# Patient Record
Sex: Male | Born: 1980 | Race: White | Hispanic: No | Marital: Married | Smoking: Former smoker
Health system: Southern US, Community
[De-identification: ages and names within clinical notes are randomized; demographics above are authoritative.]

## PROBLEM LIST (undated history)

## (undated) DIAGNOSIS — M720 Palmar fascial fibromatosis [Dupuytren]: Secondary | ICD-10-CM

## (undated) DIAGNOSIS — S62143A Displaced fracture of body of hamate [unciform] bone, unspecified wrist, initial encounter for closed fracture: Secondary | ICD-10-CM

## (undated) DIAGNOSIS — L989 Disorder of the skin and subcutaneous tissue, unspecified: Secondary | ICD-10-CM

## (undated) DIAGNOSIS — L719 Rosacea, unspecified: Secondary | ICD-10-CM

## (undated) DIAGNOSIS — F32A Depression, unspecified: Secondary | ICD-10-CM

## (undated) DIAGNOSIS — F419 Anxiety disorder, unspecified: Secondary | ICD-10-CM

## (undated) DIAGNOSIS — S6291XA Unspecified fracture of right wrist and hand, initial encounter for closed fracture: Secondary | ICD-10-CM

## (undated) DIAGNOSIS — S42023A Displaced fracture of shaft of unspecified clavicle, initial encounter for closed fracture: Secondary | ICD-10-CM

## (undated) DIAGNOSIS — Z0000 Encounter for general adult medical examination without abnormal findings: Principal | ICD-10-CM

## (undated) DIAGNOSIS — F329 Major depressive disorder, single episode, unspecified: Secondary | ICD-10-CM

## (undated) DIAGNOSIS — L659 Nonscarring hair loss, unspecified: Secondary | ICD-10-CM

## (undated) DIAGNOSIS — B019 Varicella without complication: Secondary | ICD-10-CM

## (undated) DIAGNOSIS — G4700 Insomnia, unspecified: Secondary | ICD-10-CM

## (undated) HISTORY — DX: Insomnia, unspecified: G47.00

## (undated) HISTORY — DX: Varicella without complication: B01.9

## (undated) HISTORY — PX: HAND SURGERY: SHX662

## (undated) HISTORY — DX: Anxiety disorder, unspecified: F41.9

## (undated) HISTORY — DX: Major depressive disorder, single episode, unspecified: F32.9

## (undated) HISTORY — DX: Displaced fracture of body of hamate (unciform) bone, unspecified wrist, initial encounter for closed fracture: S62.143A

## (undated) HISTORY — DX: Encounter for general adult medical examination without abnormal findings: Z00.00

## (undated) HISTORY — DX: Palmar fascial fibromatosis (dupuytren): M72.0

## (undated) HISTORY — DX: Rosacea, unspecified: L71.9

## (undated) HISTORY — DX: Depression, unspecified: F32.A

## (undated) HISTORY — DX: Unspecified fracture of right wrist and hand, initial encounter for closed fracture: S62.91XA

## (undated) HISTORY — DX: Displaced fracture of shaft of unspecified clavicle, initial encounter for closed fracture: S42.023A

## (undated) HISTORY — DX: Disorder of the skin and subcutaneous tissue, unspecified: L98.9

## (undated) HISTORY — DX: Nonscarring hair loss, unspecified: L65.9

---
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
## 2013-09-20 ENCOUNTER — Ambulatory Visit (INDEPENDENT_AMBULATORY_CARE_PROVIDER_SITE_OTHER): Payer: Private Health Insurance - Indemnity | Admitting: Family Medicine

## 2013-09-20 ENCOUNTER — Encounter: Payer: Self-pay | Admitting: Family Medicine

## 2013-09-20 ENCOUNTER — Ambulatory Visit (HOSPITAL_BASED_OUTPATIENT_CLINIC_OR_DEPARTMENT_OTHER)
Admission: RE | Admit: 2013-09-20 | Discharge: 2013-09-20 | Disposition: A | Discharge status: Home / Self Care | Payer: Private Health Insurance - Indemnity | Source: Ambulatory Visit | Attending: Family Medicine | Admitting: Family Medicine

## 2013-09-20 VITALS — BP 112/70 | HR 61 | Temp 98.1°F | Ht 70.0 in | Wt 173.1 lb

## 2013-09-20 DIAGNOSIS — X58XXXA Exposure to other specified factors, initial encounter: Secondary | ICD-10-CM | POA: Insufficient documentation

## 2013-09-20 DIAGNOSIS — F32A Depression, unspecified: Secondary | ICD-10-CM

## 2013-09-20 DIAGNOSIS — Z23 Encounter for immunization: Secondary | ICD-10-CM

## 2013-09-20 DIAGNOSIS — S62143A Displaced fracture of body of hamate [unciform] bone, unspecified wrist, initial encounter for closed fracture: Secondary | ICD-10-CM | POA: Insufficient documentation

## 2013-09-20 DIAGNOSIS — M79609 Pain in unspecified limb: Secondary | ICD-10-CM

## 2013-09-20 DIAGNOSIS — F419 Anxiety disorder, unspecified: Secondary | ICD-10-CM

## 2013-09-20 DIAGNOSIS — L659 Nonscarring hair loss, unspecified: Secondary | ICD-10-CM | POA: Insufficient documentation

## 2013-09-20 DIAGNOSIS — F341 Dysthymic disorder: Secondary | ICD-10-CM

## 2013-09-20 DIAGNOSIS — Z0000 Encounter for general adult medical examination without abnormal findings: Secondary | ICD-10-CM | POA: Insufficient documentation

## 2013-09-20 DIAGNOSIS — M79641 Pain in right hand: Secondary | ICD-10-CM

## 2013-09-20 DIAGNOSIS — S62141A Displaced fracture of body of hamate [unciform] bone, right wrist, initial encounter for closed fracture: Secondary | ICD-10-CM

## 2013-09-20 DIAGNOSIS — F329 Major depressive disorder, single episode, unspecified: Secondary | ICD-10-CM

## 2013-09-20 HISTORY — DX: Encounter for general adult medical examination without abnormal findings: Z00.00

## 2013-09-20 HISTORY — DX: Nonscarring hair loss, unspecified: L65.9

## 2013-09-20 HISTORY — DX: Displaced fracture of body of hamate (unciform) bone, unspecified wrist, initial encounter for closed fracture: S62.143A

## 2013-09-20 LAB — HEPATIC FUNCTION PANEL
ALT: 103 U/L — ABNORMAL HIGH (ref 0–53)
AST: 79 U/L — ABNORMAL HIGH (ref 0–37)
Albumin: 4.3 g/dL (ref 3.5–5.2)
Alkaline Phosphatase: 89 U/L (ref 39–117)
Indirect Bilirubin: 1 mg/dL — ABNORMAL HIGH (ref 0.0–0.9)
Total Protein: 6.7 g/dL (ref 6.0–8.3)

## 2013-09-20 LAB — LIPID PANEL
Cholesterol: 140 mg/dL (ref 0–200)
HDL: 77 mg/dL (ref >39)
LDL Cholesterol: 53 mg/dL (ref 0–99)
Total CHOL/HDL Ratio: 1.8 Ratio
Triglycerides: 52 mg/dL (ref <150)
VLDL: 10 mg/dL (ref 0–40)

## 2013-09-20 LAB — RENAL FUNCTION PANEL
BUN: 10 mg/dL (ref 6–23)
Calcium: 9.5 mg/dL (ref 8.4–10.5)
Creat: 0.81 mg/dL (ref 0.50–1.35)
Glucose, Bld: 78 mg/dL (ref 70–99)
Phosphorus: 2.9 mg/dL (ref 2.3–4.6)
Potassium: 4.8 mEq/L (ref 3.5–5.3)

## 2013-09-20 LAB — TSH: TSH: 0.865 u[IU]/mL (ref 0.350–4.500)

## 2013-09-20 MED ORDER — ESCITALOPRAM OXALATE 10 MG PO TABS: 10.0000 mg | ORAL_TABLET | Freq: Every day | ORAL | Status: DC

## 2013-09-20 MED ORDER — ALPRAZOLAM 0.25 MG PO TABS: 0.2500 mg | ORAL_TABLET | Freq: Two times a day (BID) | ORAL | Status: DC | PRN

## 2013-09-20 NOTE — Assessment & Plan Note (Signed)
Injured his hand, right 3 months ago initial xray negative, today xray shows aubacute fracture. Referred to ortho for further consideration. Given hand splint today to rest it and encouraged to use Salon pas and ice bid

## 2013-09-20 NOTE — Assessment & Plan Note (Signed)
Has been using Finasteride and Rogaine for a couple of months. Will consider referral to dermatology if persists

## 2013-09-20 NOTE — Patient Instructions (Addendum)
Preventive Care for Adults, Male  A healthy lifestyle and preventive care can promote health and wellness. Preventive health guidelines for men include the following key practices:  · A routine yearly physical is a good way to check with your caregiver about your health and preventative screening. It is a chance to share any concerns and updates on your health, and to receive a thorough exam.  · Visit your dentist for a routine exam and preventative care every 6 months. Brush your teeth twice a day and floss once a day. Good oral hygiene prevents tooth decay and gum disease.  · The frequency of eye exams is based on your age, health, family medical history, use of contact lenses, and other factors. Follow your caregiver's recommendations for frequency of eye exams.  · Eat a healthy diet. Foods like vegetables, fruits, whole grains, low-fat dairy products, and lean protein foods contain the nutrients you need without too many calories. Decrease your intake of foods high in solid fats, added sugars, and salt. Eat the right amount of calories for you. Get information about a proper diet from your caregiver, if necessary.  · Regular physical exercise is one of the most important things you can do for your health. Most adults should get at least 150 minutes of moderate-intensity exercise (any activity that increases your heart rate and causes you to sweat) each week. In addition, most adults need muscle-strengthening exercises on 2 or more days a week.  · Maintain a healthy weight. The body mass index (BMI) is a screening tool to identify possible weight problems. It provides an estimate of body fat based on height and weight. Your caregiver can help determine your BMI, and can help you achieve or maintain a healthy weight. For adults 20 years and older:  · A BMI below 18.5 is considered underweight.  · A BMI of 18.5 to 24.9 is normal.  · A BMI of 25 to 29.9 is considered overweight.  · A BMI of 30 and above is  considered obese.  · Maintain normal blood lipids and cholesterol levels by exercising and minimizing your intake of saturated fat. Eat a balanced diet with plenty of fruit and vegetables. Blood tests for lipids and cholesterol should begin at age 20 and be repeated every 5 years. If your lipid or cholesterol levels are high, you are over 50, or you are a high risk for heart disease, you may need your cholesterol levels checked more frequently. Ongoing high lipid and cholesterol levels should be treated with medicines if diet and exercise are not effective.  · If you smoke, find out from your caregiver how to quit. If you do not use tobacco, do not start.  · If you choose to drink alcohol, do not exceed 2 drinks per day. One drink is considered to be 12 ounces (355 mL) of beer, 5 ounces (148 mL) of wine, or 1.5 ounces (44 mL) of liquor.  · Avoid use of street drugs. Do not share needles with anyone. Ask for help if you need support or instructions about stopping the use of drugs.  · High blood pressure causes heart disease and increases the risk of stroke. Your blood pressure should be checked at least every 1 to 2 years. Ongoing high blood pressure should be treated with medicines, if weight loss and exercise are not effective.  · If you are 45 to 33 years old, ask your caregiver if you should take aspirin to prevent heart disease.  · Diabetes screening involves taking   a blood sample to check your fasting blood sugar level. This should be done once every 3 years, after age 45, if you are within normal weight and without risk factors for diabetes. Testing should be considered at a younger age or be carried out more frequently if you are overweight and have at least 1 risk factor for diabetes.  · Colorectal cancer can be detected and often prevented. Most routine colorectal cancer screening begins at the age of 50 and continues through age 75. However, your caregiver may recommend screening at an earlier age if you  have risk factors for colon cancer. On a yearly basis, your caregiver may provide home test kits to check for hidden blood in the stool. Use of a small camera at the end of a tube, to directly examine the colon (sigmoidoscopy or colonoscopy), can detect the earliest forms of colorectal cancer. Talk to your caregiver about this at age 50, when routine screening begins.  Direct examination of the colon should be repeated every 5 to 10 years through age 75, unless early forms of pre-cancerous polyps or small growths are found.  · Hepatitis C blood testing is recommended for all people born from 1945 through 1965 and any individual with known risks for hepatitis C.  · Practice safe sex. Use condoms and avoid high-risk sexual practices to reduce the spread of sexually transmitted infections (STIs). STIs include gonorrhea, chlamydia, syphilis, trichomonas, herpes, HPV, and human immunodeficiency virus (HIV). Herpes, HIV, and HPV are viral illnesses that have no cure. They can result in disability, cancer, and death.  · A one-time screening for abdominal aortic aneurysm (AAA) and surgical repair of large AAAs by sound wave imaging (ultrasonography) is recommended for ages 65 to 75 years who are current or former smokers.  · Healthy men should no longer receive prostate-specific antigen (PSA) blood tests as part of routine cancer screening. Consult with your caregiver about prostate cancer screening.  · Testicular cancer screening is not recommended for adult males who have no symptoms. Screening includes self-exam, caregiver exam, and other screening tests. Consult with your caregiver about any symptoms you have or any concerns you have about testicular cancer.  · Use sunscreen with skin protection factor (SPF) of 30 or more. Apply sunscreen liberally and repeatedly throughout the day. You should seek shade when your shadow is shorter than you. Protect yourself by wearing long sleeves, pants, a wide-brimmed hat, and  sunglasses year round, whenever you are outdoors.  · Once a month, do a whole body skin exam, using a mirror to look at the skin on your back. Notify your caregiver of new moles, moles that have irregular borders, moles that are larger than a pencil eraser, or moles that have changed in shape or color.  · Stay current with required immunizations.  · Influenza. You need a dose every fall (or winter). The composition of the flu vaccine changes each year, so being vaccinated once is not enough.  · Pneumococcal polysaccharide. You need 1 to 2 doses if you smoke cigarettes or if you have certain chronic medical conditions. You need 1 dose at age 65 (or older) if you have never been vaccinated.  · Tetanus, diphtheria, pertussis (Tdap, Td). Get 1 dose of Tdap vaccine if you are younger than age 65 years, are over 65 and have contact with an infant, are a healthcare worker, or simply want to be protected from whooping cough. After that, you need a Td booster dose every 10 years. Consult your   caregiver if you have not had at least 3 tetanus and diphtheria-containing shots sometime in your life or have a deep or dirty wound.  · HPV. This vaccine is recommended for males 13 through 33 years of age. This vaccine may be given to men 22 through 33 years of age who have not completed the 3 dose series. It is recommended for men through age 26 who have sex with men or whose immune system is weakened because of HIV infection, other illness, or medications. The vaccine is given in 3 doses over 6 months.  · Measles, mumps, rubella (MMR). You need at least 1 dose of MMR if you were born in 1957 or later. You may also need a 2nd dose.  · Meningococcal. If you are age 19 to 21 years and a first-year college student living in a residence hall, or have one of several medical conditions, you need to get vaccinated against meningococcal disease. You may also need additional booster doses.  · Zoster (shingles). If you are age 60 years or  older, you should get this vaccine.  · Varicella (chickenpox). If you have never had chickenpox or you were vaccinated but received only 1 dose, talk to your caregiver to find out if you need this vaccine.  · Hepatitis A. You need this vaccine if you have a specific risk factor for hepatitis A virus infection, or you simply wish to be protected from this disease. The vaccine is usually given as 2 doses, 6 to 18 months apart.  · Hepatitis B. You need this vaccine if you have a specific risk factor for hepatitis B virus infection or you simply wish to be protected from this disease. The vaccine is given in 3 doses, usually over 6 months.  Preventative Service / Frequency  Ages 19 to 39  · Blood pressure check.** / Every 1 to 2 years.  · Lipid and cholesterol check.** / Every 5 years beginning at age 20.  · Hepatitis C blood test.** / For any individual with known risks for hepatitis C.  · Skin self-exam. / Monthly.  · Influenza immunization.** / Every year.  · Pneumococcal polysaccharide immunization.** / 1 to 2 doses if you smoke cigarettes or if you have certain chronic medical conditions.  · Tetanus, diphtheria, pertussis (Tdap,Td) immunization. / A one-time dose of Tdap vaccine. After that, you need a Td booster dose every 10 years.  · HPV immunization. / 3 doses over 6 months, if 26 and younger.  · Measles, mumps, rubella (MMR) immunization. / You need at least 1 dose of MMR if you were born in 1957 or later. You may also need a 2nd dose.  · Meningococcal immunization. / 1 dose if you are age 19 to 21 years and a first-year college student living in a residence hall, or have one of several medical conditions, you need to get vaccinated against meningococcal disease. You may also need additional booster doses.  · Varicella immunization.** / Consult your caregiver.  · Hepatitis A immunization.** / Consult your caregiver. 2 doses, 6 to 18 months apart.  · Hepatitis B immunization.** / Consult your caregiver. 3 doses  usually over 6 months.  Ages 40 to 64  · Blood pressure check.** / Every 1 to 2 years.  · Lipid and cholesterol check.** / Every 5 years beginning at age 20.  · Fecal occult blood test (FOBT) of stool. / Every year beginning at age 50 and continuing until age 75. You may not have   to do this test if you get colonoscopy every 10 years.  · Flexible sigmoidoscopy** or colonoscopy.** / Every 5 years for a flexible sigmoidoscopy or every 10 years for a colonoscopy beginning at age 50 and continuing until age 75.  · Hepatitis C blood test.** / For all people born from 1945 through 1965 and any individual with known risks for hepatitis C.  · Skin self-exam. / Monthly.  · Influenza immunization.** / Every year.  · Pneumococcal polysaccharide immunization.** / 1 to 2 doses if you smoke cigarettes or if you have certain chronic medical conditions.  · Tetanus, diphtheria, pertussis (Tdap/Td) immunization.** / A one-time dose of Tdap vaccine. After that, you need a Td booster dose every 10 years.  · Measles, mumps, rubella (MMR) immunization.  / You need at least 1 dose of MMR if you were born in 1957 or later. You may also need a 2nd dose.  · Varicella immunization.**/ Consult your caregiver.  · Meningococcal immunization.** / Consult your caregiver.  · Hepatitis A immunization.** / Consult your caregiver. 2 doses, 6 to 18 months apart.  · Hepatitis B immunization.** / Consult your caregiver. 3 doses, usually over 6 months.  Ages 65 and over  · Blood pressure check.** / Every 1 to 2 years.  · Lipid and cholesterol check.**/ Every 5 years beginning at age 20.  · Fecal occult blood test (FOBT) of stool. / Every year beginning at age 50 and continuing until age 75. You may not have to do this test if you get colonoscopy every 10 years.  · Flexible sigmoidoscopy** or colonoscopy.** / Every 5 years for a flexible sigmoidoscopy or every 10 years for a colonoscopy beginning at age 50 and continuing until age 75.  · Hepatitis C blood  test.** / For all people born from 1945 through 1965 and any individual with known risks for hepatitis C.  · Abdominal aortic aneurysm (AAA) screening.** / A one-time screening for ages 65 to 75 years who are current or former smokers.  · Skin self-exam. / Monthly.  · Influenza immunization.** / Every year.  · Pneumococcal polysaccharide immunization.** / 1 dose at age 65 (or older) if you have never been vaccinated.  · Tetanus, diphtheria, pertussis (Tdap, Td) immunization. / A one-time dose of Tdap vaccine if you are over 65 and have contact with an infant, are a healthcare worker, or simply want to be protected from whooping cough. After that, you need a Td booster dose every 10 years.  · Varicella immunization. ** / Consult your caregiver.  · Meningococcal immunization.** / Consult your caregiver.  · Hepatitis A immunization. ** / Consult your caregiver. 2 doses, 6 to 18 months apart.  · Hepatitis B immunization.** / Check with your caregiver. 3 doses, usually over 6 months.  **Family history and personal history of risk and conditions may change your caregiver's recommendations.  Document Released: 02/04/2002 Document Revised: 03/02/2012 Document Reviewed: 05/06/2011  [employer]® Patient Information ©2014 [employer].

## 2013-09-20 NOTE — Progress Notes (Signed)
Patient ID: Brandon [name], male   DOB: [birth year], 32 y.o.   MRN: [id number] Brandon [name] [id number] 03/09/1981 09/20/2013      Progress Note-Follow Up  Subjective  Chief Complaint  Chief Complaint  Patient presents with  . Establish Care    new patient  . Injections    flu    HPI  Patient is a 32 year old Caucasian male who is in today for new patient appointment with several concerns. First is he injured his right hand while doing renovations on his house about 3 months ago. He's had persistent pain and swelling ever since. Pain is at the aspect of the hand he suffered blunt trauma an x-ray initially did not show any fracture at bedtime. Now any ulnar or radial rotation of the wrist causes increased pain. Gripping is painful. He also notes a high level of anxiety it is noted that patient's job. He is having episodes of panic attacks with palpitations and shortness of breath occurring randomly. Otherwise she reports health. He has been struggling with hair loss for the last year and has been using Propecia and Rogaine for a couple months with good results. Has taken Paxil in the past for anxiety and college and does believe it was helpful  Past Medical History  Diagnosis Date  . Chicken pox as a child  . Anxiety   . Anxiety and depression   . Alopecia 09/20/2013  . Closed hamate fracture 09/20/2013  . Preventative health care 09/20/2013    Past Surgical History  Procedure Laterality Date  . Wisdom tooth extraction  1991    Family History  Problem Relation Age of Onset  . Depression Mother   . Anxiety disorder Mother   . Multiple sclerosis Mother   . Atrial fibrillation Father   . Hyperlipidemia Father   . Heart disease Maternal Grandfather   . Stroke Maternal Grandfather     History   Social History  . Marital Status: Single    Spouse Name: N/A    Number of Children: N/A  . Years of Education: N/A   Occupational History  . Not on file.   Social History Main Topics   . Smoking status: Former Smoker -- 0.01 packs/day for 3 years    Types: Cigarettes    Start date: 12/24/2011  . Smokeless tobacco: Never Used     Comment: off and on for a couple years  . Alcohol Use: Yes     Comment: 12 beers  . Drug Use: No  . Sexual Activity: Yes    Partners: Female   Other Topics Concern  . Not on file   Social History Narrative  . No narrative on file    No current outpatient prescriptions on file prior to visit.   No current facility-administered medications on file prior to visit.    No Known Allergies  Review of Systems  Review of Systems  Constitutional: Negative for fever and malaise/fatigue.  HENT: Negative for congestion.   Eyes: Negative for discharge.  Respiratory: Negative for shortness of breath.   Cardiovascular: Negative for chest pain, palpitations and leg swelling.  Gastrointestinal: Negative for nausea, abdominal pain and diarrhea.  Genitourinary: Negative for dysuria.  Musculoskeletal: Positive for joint pain. Negative for myalgias and falls.       Right hand pain alon ulnar aspect. Suffered trauma to hand 3 months ago with persistent pain and swelling.   Skin: Negative for rash.  Neurological: Negative for loss of consciousness and headaches.  Endo/Heme/Allergies: Negative  for polydipsia.  Psychiatric/Behavioral: Negative for depression and suicidal ideas. The patient is not nervous/anxious and does not have insomnia.     Objective  BP 112/70  Pulse 61  Temp(Src) 98.1 F (36.7 C) (Oral)  Ht 5\' 10"  (1.778 m)  Wt 173 lb 1.3 oz (78.509 kg)  BMI 24.83 kg/m2  SpO2 97%  Physical Exam  Physical Exam  Constitutional: He is oriented to person, place, and time and well-developed, well-nourished, and in no distress. No distress.  HENT:  Head: Normocephalic and atraumatic.  Eyes: Conjunctivae are normal.  Neck: Neck supple. No thyromegaly present.  Cardiovascular: Normal rate, regular rhythm and normal heart sounds.   No  murmur heard. Pulmonary/Chest: Effort normal and breath sounds normal. No respiratory distress.  Abdominal: He exhibits no distension and no mass. There is no tenderness.  Musculoskeletal: He exhibits edema and tenderness.  Over base of 5 th metatarsal swelling and pain  Neurological: He is alert and oriented to person, place, and time.  Skin: Skin is warm.  Psychiatric: Memory, affect and judgment normal.       Assessment & Plan  Alopecia Has been using Finasteride and Rogaine for a couple of months. Will consider referral to dermatology if persists  Anxiety and depression Significant stress at work started on Lexapro 10 mg daily and may use Alprazolam prn and return in 8 weeks or as needed. Warned regarding possible side effects of both meds.  Closed hamate fracture Injured his hand, right 3 months ago initial xray negative, today xray shows aubacute fracture. Referred to ortho for further consideration. Given hand splint today to rest it and encouraged to use Salon pas and ice bid  Preventative health care Fasting labs checked today, encouraged heart healthy diet and regular exercise. Given flu shot today

## 2013-09-20 NOTE — Assessment & Plan Note (Addendum)
Fasting labs checked today, encouraged heart healthy diet and regular exercise. Given flu shot today

## 2013-09-20 NOTE — Assessment & Plan Note (Signed)
Significant stress at work started on Lexapro 10 mg daily and may use Alprazolam prn and return in 8 weeks or as needed. Warned regarding possible side effects of both meds.

## 2013-09-21 ENCOUNTER — Telehealth: Payer: Self-pay

## 2013-09-21 LAB — CBC
HCT: 45.3 % (ref 39.0–52.0)
Hemoglobin: 16 g/dL (ref 13.0–17.0)
MCHC: 35.3 g/dL (ref 30.0–36.0)

## 2013-09-21 NOTE — Telephone Encounter (Signed)
I already did his ortho appt yesterday

## 2013-09-21 NOTE — Telephone Encounter (Signed)
Pt notified of fracture. Pt states needs a referral put in to f/u with ortho. Pt requesting [facility].

## 2013-09-21 NOTE — Telephone Encounter (Signed)
Message copied by Darnell Level on Tue Sep 21, 2013 11:51 AM ------      Message from: Danise Edge A      Created: Mon Sep 20, 2013  4:36 PM       Notify fracture of hamate bone in right hand, subacute, follow up with ortho ------

## 2013-09-22 ENCOUNTER — Telehealth: Payer: Self-pay

## 2013-09-22 NOTE — Telephone Encounter (Signed)
Printed and pt informed that its at the front desk

## 2013-09-22 NOTE — Telephone Encounter (Signed)
Pt called and stated he called and rescheduled his Ortho appt for tomorrow, they are requesting that he bring a copy of the hand images/x-ray. Patient needs to pick up before tomorrow before 3:00 pm.

## 2013-10-16 DIAGNOSIS — S42023A Displaced fracture of shaft of unspecified clavicle, initial encounter for closed fracture: Secondary | ICD-10-CM

## 2013-10-16 HISTORY — DX: Displaced fracture of shaft of unspecified clavicle, initial encounter for closed fracture: S42.023A

## 2013-11-23 ENCOUNTER — Ambulatory Visit: Payer: Private Health Insurance - Indemnity | Admitting: Family Medicine

## 2013-12-28 ENCOUNTER — Ambulatory Visit: Payer: Private Health Insurance - Indemnity | Admitting: Family Medicine

## 2014-01-07 ENCOUNTER — Ambulatory Visit: Payer: Private Health Insurance - Indemnity | Admitting: Family Medicine

## 2014-03-01 ENCOUNTER — Ambulatory Visit (INDEPENDENT_AMBULATORY_CARE_PROVIDER_SITE_OTHER): Payer: Managed Care, Other (non HMO) | Admitting: Family Medicine

## 2014-03-01 ENCOUNTER — Encounter: Payer: Self-pay | Admitting: Family Medicine

## 2014-03-01 VITALS — BP 114/82 | HR 67 | Temp 98.3°F | Ht 70.0 in | Wt 183.0 lb

## 2014-03-01 DIAGNOSIS — L659 Nonscarring hair loss, unspecified: Secondary | ICD-10-CM

## 2014-03-01 DIAGNOSIS — F32A Depression, unspecified: Secondary | ICD-10-CM

## 2014-03-01 DIAGNOSIS — G4700 Insomnia, unspecified: Secondary | ICD-10-CM

## 2014-03-01 DIAGNOSIS — F341 Dysthymic disorder: Secondary | ICD-10-CM

## 2014-03-01 DIAGNOSIS — F419 Anxiety disorder, unspecified: Secondary | ICD-10-CM

## 2014-03-01 DIAGNOSIS — K769 Liver disease, unspecified: Secondary | ICD-10-CM

## 2014-03-01 DIAGNOSIS — F329 Major depressive disorder, single episode, unspecified: Secondary | ICD-10-CM

## 2014-03-01 DIAGNOSIS — R945 Abnormal results of liver function studies: Secondary | ICD-10-CM

## 2014-03-01 LAB — HEPATIC FUNCTION PANEL
ALT: 68 U/L — AB (ref 0–53)
AST: 44 U/L — AB (ref 0–37)
Albumin: 4.4 g/dL (ref 3.5–5.2)
Alkaline Phosphatase: 95 U/L (ref 39–117)
BILIRUBIN DIRECT: 0.3 mg/dL (ref 0.0–0.3)
BILIRUBIN TOTAL: 1.1 mg/dL (ref 0.2–1.2)
Indirect Bilirubin: 0.8 mg/dL (ref 0.2–1.2)
Total Protein: 7 g/dL (ref 6.0–8.3)

## 2014-03-01 MED ORDER — ESCITALOPRAM OXALATE 10 MG PO TABS: 10.0000 mg | ORAL_TABLET | Freq: Every day | ORAL | Status: DC

## 2014-03-01 MED ORDER — ALPRAZOLAM 0.25 MG PO TABS: 0.2500 mg | ORAL_TABLET | Freq: Two times a day (BID) | ORAL | Status: DC | PRN

## 2014-03-01 NOTE — Progress Notes (Signed)
Pre visit review using our clinic review tool, if applicable. No additional management support is needed unless otherwise documented below in the visit note. 

## 2014-03-06 ENCOUNTER — Encounter: Payer: Self-pay | Admitting: Family Medicine

## 2014-03-06 DIAGNOSIS — R945 Abnormal results of liver function studies: Secondary | ICD-10-CM | POA: Insufficient documentation

## 2014-03-06 DIAGNOSIS — G4700 Insomnia, unspecified: Secondary | ICD-10-CM | POA: Insufficient documentation

## 2014-03-06 HISTORY — DX: Insomnia, unspecified: G47.00

## 2014-03-06 NOTE — Assessment & Plan Note (Signed)
Improved since last check, encouraged minimize simple carbs, trans and saturated fats.
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
## 2014-03-06 NOTE — Assessment & Plan Note (Signed)
Since last visit with some PTSD symptoms. Restart Lexapro

## 2014-03-06 NOTE — Assessment & Plan Note (Signed)
Encouraged good sleep hygiene such as dark, quiet room. No blue/green glowing lights such as computer screens in bedroom. No alcohol or stimulants in evening. Cut down on caffeine as able. Regular exercise is helpful but not just prior to bed time. May try Alprazolam prn infrequently
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
## 2014-03-29 ENCOUNTER — Telehealth: Payer: Self-pay | Admitting: Family Medicine

## 2014-03-29 NOTE — Telephone Encounter (Signed)
Request refill on Escitalopram 10mg 

## 2014-03-29 NOTE — Telephone Encounter (Signed)
Refill already sent on 03/01/14, #30 x 2 refills.

## 2014-06-27 ENCOUNTER — Ambulatory Visit: Payer: Managed Care, Other (non HMO) | Admitting: Family Medicine

## 2014-06-27 ENCOUNTER — Telehealth: Payer: Self-pay | Admitting: Family Medicine

## 2014-06-27 DIAGNOSIS — Z0289 Encounter for other administrative examinations: Secondary | ICD-10-CM

## 2014-06-27 NOTE — Telephone Encounter (Signed)
NOS appointment today

## 2014-06-27 NOTE — Telephone Encounter (Signed)
Lm on vm to return call and reschedule appt

## 2014-06-27 NOTE — Telephone Encounter (Signed)
Recommend an appt in next couple of months

## 2014-08-04 ENCOUNTER — Ambulatory Visit (INDEPENDENT_AMBULATORY_CARE_PROVIDER_SITE_OTHER): Payer: Managed Care, Other (non HMO) | Admitting: Family Medicine

## 2014-08-04 ENCOUNTER — Encounter: Payer: Self-pay | Admitting: Family Medicine

## 2014-08-04 VITALS — BP 120/72 | HR 61 | Temp 98.3°F | Ht 70.0 in | Wt 183.1 lb

## 2014-08-04 DIAGNOSIS — F419 Anxiety disorder, unspecified: Secondary | ICD-10-CM

## 2014-08-04 DIAGNOSIS — R945 Abnormal results of liver function studies: Secondary | ICD-10-CM

## 2014-08-04 DIAGNOSIS — G4700 Insomnia, unspecified: Secondary | ICD-10-CM

## 2014-08-04 DIAGNOSIS — F341 Dysthymic disorder: Secondary | ICD-10-CM

## 2014-08-04 DIAGNOSIS — S6291XA Unspecified fracture of right wrist and hand, initial encounter for closed fracture: Secondary | ICD-10-CM

## 2014-08-04 DIAGNOSIS — F32A Depression, unspecified: Secondary | ICD-10-CM

## 2014-08-04 DIAGNOSIS — L659 Nonscarring hair loss, unspecified: Secondary | ICD-10-CM

## 2014-08-04 DIAGNOSIS — K769 Liver disease, unspecified: Secondary | ICD-10-CM

## 2014-08-04 DIAGNOSIS — F329 Major depressive disorder, single episode, unspecified: Secondary | ICD-10-CM

## 2014-08-04 DIAGNOSIS — S6291XG Unspecified fracture of right wrist and hand, subsequent encounter for fracture with delayed healing: Secondary | ICD-10-CM

## 2014-08-04 HISTORY — DX: Unspecified fracture of right wrist and hand, initial encounter for closed fracture: S62.91XA

## 2014-08-04 NOTE — Patient Instructions (Signed)
Insomnia Insomnia is frequent trouble falling and/or staying asleep. Insomnia can be a long term problem or a short term problem. Both are common. Insomnia can be a short term problem when the wakefulness is related to a certain stress or worry. Long term insomnia is often related to ongoing stress during waking hours and/or poor sleeping habits. Overtime, sleep deprivation itself can make the problem worse. Every little thing feels more severe because you are overtired and your ability to cope is decreased. CAUSES   Stress, anxiety, and depression.  Poor sleeping habits.  Distractions such as TV in the bedroom.  Naps close to bedtime.  Engaging in emotionally charged conversations before bed.  Technical reading before sleep.  Alcohol and other sedatives. They may make the problem worse. They can hurt normal sleep patterns and normal dream activity.  Stimulants such as caffeine for several hours prior to bedtime.  Pain syndromes and shortness of breath can cause insomnia.  Exercise late at night.  Changing time zones may cause sleeping problems (jet lag). It is sometimes helpful to have someone observe your sleeping patterns. They should look for periods of not breathing during the night (sleep apnea). They should also look to see how long those periods last. If you live alone or observers are uncertain, you can also be observed at a sleep clinic where your sleep patterns will be professionally monitored. Sleep apnea requires a checkup and treatment. Give your caregivers your medical history. Give your caregivers observations your family has made about your sleep.  SYMPTOMS   Not feeling rested in the morning.  Anxiety and restlessness at bedtime.  Difficulty falling and staying asleep. TREATMENT   Your caregiver may prescribe treatment for an underlying medical disorders. Your caregiver can give advice or help if you are using alcohol or other drugs for self-medication. Treatment  of underlying problems will usually eliminate insomnia problems.  Medications can be prescribed for short time use. They are generally not recommended for lengthy use.  Over-the-counter sleep medicines are not recommended for lengthy use. They can be habit forming.  You can promote easier sleeping by making lifestyle changes such as:  Using relaxation techniques that help with breathing and reduce muscle tension.  Exercising earlier in the day.  Changing your diet and the time of your last meal. No night time snacks.  Establish a regular time to go to bed.  Counseling can help with stressful problems and worry.  Soothing music and white noise may be helpful if there are background noises you cannot remove.  Stop tedious detailed work at least one hour before bedtime. HOME CARE INSTRUCTIONS   Keep a diary. Inform your caregiver about your progress. This includes any medication side effects. See your caregiver regularly. Take note of:  Times when you are asleep.  Times when you are awake during the night.  The quality of your sleep.  How you feel the next day. This information will help your caregiver care for you.  Get out of bed if you are still awake after 15 minutes. Read or do some quiet activity. Keep the lights down. Wait until you feel sleepy and go back to bed.  Keep regular sleeping and waking hours. Avoid naps.  Exercise regularly.  Avoid distractions at bedtime. Distractions include watching television or engaging in any intense or detailed activity like attempting to balance the household checkbook.  Develop a bedtime ritual. Keep a familiar routine of bathing, brushing your teeth, climbing into bed at the same   time each night, listening to soothing music. Routines increase the success of falling to sleep faster.  Use relaxation techniques. This can be using breathing and muscle tension release routines. It can also include visualizing peaceful scenes. You can  also help control troubling or intruding thoughts by keeping your mind occupied with boring or repetitive thoughts like the old concept of counting sheep. You can make it more creative like imagining planting one beautiful flower after another in your backyard garden.  During your day, work to eliminate stress. When this is not possible use some of the previous suggestions to help reduce the anxiety that accompanies stressful situations. MAKE SURE YOU:   Understand these instructions.  Will watch your condition.  Will get help right away if you are not doing well or get worse. Document Released: 12/06/2000 Document Revised: 03/02/2012 Document Reviewed: 01/06/2008 Exit[employer] Patient Information 2015 [employer]. This information is not intended to replace advice given to you by your health care provider. Make sure you discuss any questions you have with your health care provider.  

## 2014-08-04 NOTE — Progress Notes (Signed)
Pre visit review using our clinic review tool, if applicable. No additional management support is needed unless otherwise documented below in the visit note. 

## 2014-08-04 NOTE — Assessment & Plan Note (Addendum)
Melatonin 5-10 mg qhs prn, using Alprazolam prn infrequently. Encouraged good sleep hygiene such as dark, quiet room. No blue/green glowing lights such as computer screens in bedroom. No alcohol or stimulants in evening. Cut down on caffeine as able. Regular exercise is helpful but not just prior to bed time.

## 2014-08-07 ENCOUNTER — Encounter: Payer: Self-pay | Admitting: Family Medicine
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
## 2014-08-07 NOTE — Assessment & Plan Note (Signed)
Mild, improved on last blood draw, minimize simple carbs and saturated fats.

## 2014-08-07 NOTE — Assessment & Plan Note (Signed)
Stopped his Finasteride for some ED complications and is doing much better

## 2014-08-07 NOTE — Assessment & Plan Note (Signed)
Has had a job change and is doing well. Uses Alprazolam very infrequently and has stopped lexapro. No changes

## 2014-08-07 NOTE — Assessment & Plan Note (Signed)
Non healing fracture is actually in need of surgery but is waiting til fall.

## 2014-09-22 ENCOUNTER — Other Ambulatory Visit: Payer: Self-pay | Admitting: Family Medicine

## 2014-09-22 NOTE — Telephone Encounter (Signed)
Last RX done on 03-01-14  Last OV 08-04-14  rx printed for md to sign and fax

## 2014-12-22 ENCOUNTER — Other Ambulatory Visit: Payer: Self-pay | Admitting: Family Medicine

## 2014-12-26 NOTE — Telephone Encounter (Signed)
ALPRAZolam (XANAX) 0.25 MG tablet  Last refill: 09/22/2014 # 30, 1 refill Last OV: 08/04/2014 No UDS on file

## 2015-01-26 ENCOUNTER — Telehealth: Payer: Self-pay | Admitting: *Deleted

## 2015-01-26 ENCOUNTER — Encounter: Payer: Managed Care, Other (non HMO) | Admitting: Family Medicine

## 2015-01-26 NOTE — Telephone Encounter (Signed)
Charge fee

## 2015-01-26 NOTE — Telephone Encounter (Signed)
Pt did not show for appointment 01/26/15 at 8:30am for CPE.  Charge no show fee?

## 2015-01-27 ENCOUNTER — Encounter: Payer: Self-pay | Admitting: Family Medicine

## 2015-03-01 ENCOUNTER — Other Ambulatory Visit: Payer: Self-pay | Admitting: Family Medicine

## 2015-03-01 ENCOUNTER — Encounter: Payer: Self-pay | Admitting: Physician Assistant

## 2015-03-01 NOTE — Telephone Encounter (Signed)
Last filled: 12/26/14 Amt: 30, 1 refill Last OV:  08/04/14 No contract or UDS  Next appt:  04/17/15  Please advise.

## 2015-03-01 NOTE — Telephone Encounter (Signed)
Refill granted in Dr. Frederik Pear absence.  Needs to pick up Rx so he can sign contract and give a UDS per new office policy.

## 2015-04-17 ENCOUNTER — Ambulatory Visit: Payer: Managed Care, Other (non HMO) | Admitting: Family Medicine

## 2015-05-04 ENCOUNTER — Ambulatory Visit (INDEPENDENT_AMBULATORY_CARE_PROVIDER_SITE_OTHER): Payer: Managed Care, Other (non HMO) | Admitting: Medical

## 2015-05-04 ENCOUNTER — Encounter: Payer: Self-pay | Admitting: Medical

## 2015-05-04 VITALS — BP 122/81 | HR 69 | Temp 98.5°F | Ht 70.0 in | Wt 167.0 lb

## 2015-05-04 DIAGNOSIS — J0100 Acute maxillary sinusitis, unspecified: Secondary | ICD-10-CM

## 2015-05-04 DIAGNOSIS — J329 Chronic sinusitis, unspecified: Secondary | ICD-10-CM | POA: Insufficient documentation

## 2015-05-04 MED ORDER — AZITHROMYCIN 250 MG PO TABS
ORAL_TABLET | ORAL | Status: DC
Start: 2015-05-04 — End: 2015-07-31

## 2015-05-04 MED ORDER — HYDROCODONE-HOMATROPINE 5-1.5 MG/5ML PO SYRP: 5.0000 mL | ORAL_SOLUTION | Freq: Three times a day (TID) | ORAL | Status: DC | PRN

## 2015-05-04 NOTE — Assessment & Plan Note (Signed)
Following allergy symptoms initially then got sinus infection. Now may have bronchitis as well.  Rx hydromet for cough. Flonase otc for nasal congestion and azithromycin antibiotic.  If symptoms persist would recommend chest xray.

## 2015-05-04 NOTE — Patient Instructions (Signed)
Sinusitis Following allergy symptoms initially then got sinus infection. Now may have bronchitis as well.  Rx hydromet for cough. Flonase otc for nasal congestion and azithromycin antibiotic.  If symptoms persist would recommend chest xray.    Follow in 7 days pcp or as needed

## 2015-05-04 NOTE — Progress Notes (Signed)
Pre visit review using our clinic review tool, if applicable. No additional management support is needed unless otherwise documented below in the visit note. 
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
## 2015-05-09 ENCOUNTER — Other Ambulatory Visit: Payer: Self-pay | Admitting: Family Medicine

## 2015-05-09 NOTE — Telephone Encounter (Signed)
Last refill for Alprazolam was on 03/01/15  #30 with 0 refills. Last office visit with PCP 08/04/14 Next scheduled appointment with Dr. Charlett Blake 07/31/15  Advise on Alprazolam refill.

## 2015-05-09 NOTE — Telephone Encounter (Signed)
Caller: Weldon Nouri Ph#: [phone], work or [phone], cell  Pt requesting refill on ALPRAZolam (XANAX) 0.25 MG tablet. Taking 1-2/day as needed. Doesn't necessary use every day. Has 6 doses on hand.

## 2015-05-11 ENCOUNTER — Encounter: Payer: Self-pay | Admitting: Family Medicine

## 2015-05-11 MED ORDER — ALPRAZOLAM 0.25 MG PO TABS: ORAL_TABLET | ORAL | Status: DC

## 2015-05-11 NOTE — Telephone Encounter (Signed)
Notify him we have had to change our policy. He has to be seen every 6 months if he wants to be able to call in for refills on the Alprazolam since it is a controlled substance, needs a contract and a UDS. Can dispense #10 of the Alprazolam with same strength and same sig til he can be seen

## 2015-05-11 NOTE — Telephone Encounter (Signed)
Called the patient left msg. To call back 

## 2015-05-11 NOTE — Telephone Encounter (Signed)
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

## 2015-05-11 NOTE — Telephone Encounter (Signed)
Patient informed of PCP instructions regarding refill.  Chartered loss adjuster and put with hardcopy of Alprazolam.  Patient will pickup hardcopy on 05/12/15, sign contract and do UDS, also will try and move up appt.Marland Kitchen

## 2015-07-11 ENCOUNTER — Telehealth: Payer: Self-pay | Admitting: Family Medicine

## 2015-07-11 NOTE — Telephone Encounter (Signed)
pre visit letter mailed 07/10/15

## 2015-07-31 ENCOUNTER — Ambulatory Visit (INDEPENDENT_AMBULATORY_CARE_PROVIDER_SITE_OTHER): Payer: Managed Care, Other (non HMO) | Admitting: Family Medicine

## 2015-07-31 ENCOUNTER — Encounter: Payer: Self-pay | Admitting: Family Medicine

## 2015-07-31 VITALS — BP 120/84 | HR 90 | Temp 98.6°F | Ht 70.0 in | Wt 169.5 lb

## 2015-07-31 DIAGNOSIS — M720 Palmar fascial fibromatosis [Dupuytren]: Secondary | ICD-10-CM | POA: Diagnosis not present

## 2015-07-31 DIAGNOSIS — F329 Major depressive disorder, single episode, unspecified: Secondary | ICD-10-CM

## 2015-07-31 DIAGNOSIS — F4323 Adjustment disorder with mixed anxiety and depressed mood: Secondary | ICD-10-CM

## 2015-07-31 DIAGNOSIS — Z0000 Encounter for general adult medical examination without abnormal findings: Secondary | ICD-10-CM | POA: Diagnosis not present

## 2015-07-31 DIAGNOSIS — R059 Cough, unspecified: Secondary | ICD-10-CM

## 2015-07-31 DIAGNOSIS — G4700 Insomnia, unspecified: Secondary | ICD-10-CM | POA: Diagnosis not present

## 2015-07-31 DIAGNOSIS — F419 Anxiety disorder, unspecified: Secondary | ICD-10-CM

## 2015-07-31 DIAGNOSIS — R05 Cough: Secondary | ICD-10-CM

## 2015-07-31 DIAGNOSIS — F32A Depression, unspecified: Secondary | ICD-10-CM

## 2015-07-31 DIAGNOSIS — K7689 Other specified diseases of liver: Secondary | ICD-10-CM | POA: Diagnosis not present

## 2015-07-31 DIAGNOSIS — J0100 Acute maxillary sinusitis, unspecified: Secondary | ICD-10-CM | POA: Diagnosis not present

## 2015-07-31 DIAGNOSIS — R945 Abnormal results of liver function studies: Secondary | ICD-10-CM

## 2015-07-31 DIAGNOSIS — R509 Fever, unspecified: Secondary | ICD-10-CM

## 2015-07-31 MED ORDER — ALPRAZOLAM 0.25 MG PO TABS: ORAL_TABLET | ORAL | Status: DC

## 2015-07-31 MED ORDER — DOXYCYCLINE HYCLATE 100 MG PO TABS: 100.0000 mg | ORAL_TABLET | Freq: Two times a day (BID) | ORAL | Status: DC

## 2015-07-31 MED ORDER — HYDROCODONE-HOMATROPINE 5-1.5 MG/5ML PO SYRP: 5.0000 mL | ORAL_SOLUTION | Freq: Every evening | ORAL | Status: DC | PRN

## 2015-07-31 NOTE — Patient Instructions (Addendum)
Melatonin or L Tryptophan caps Encouraged increased rest and hydration, add probiotics such as Dig[employer]ive [employer]vantage or [facility], zinc such as Coldeze or Xicam. Treat fevers as needed Mucinex twice daily x 10 days Vitamin C 500 to 1000 mg daily Elderberry liquid 3 x daily Aged or black garlic daily [email or web address] Insomnia Insomnia is frequent trouble falling and/or staying asleep. Insomnia can be a long term problem or a short term problem. Both are common. Insomnia can be a short term problem when the wakefulness is related to a certain stress or worry. Long term insomnia is often related to ongoing stress during waking hours and/or poor sleeping habits. Overtime, sleep deprivation itself can make the problem worse. Every little thing feels more severe because you are overtired and your ability to cope is decreased. CAUSES   Stress, anxiety, and depression.  Poor sleeping habits.  Distractions such as TV in the bedroom.  Naps close to bedtime.  Engaging in emotionally charged conversations before bed.  Technical reading before sleep.  Alcohol and other sedatives. They may make the problem worse. They can hurt normal sleep patterns and normal dream activity.  Stimulants such as caffeine for several hours prior to bedtime.  Pain syndromes and shortness of breath can cause insomnia.  Exercise late at night.  Changing time zones may cause sleeping problems (jet lag). It is sometimes helpful to have someone observe your sleeping patterns. They should look for periods of not breathing during the night (sleep apnea). They should also look to see how long those periods last. If you live alone or observers are uncertain, you can also be observed at a sleep clinic where your sleep patterns will be professionally monitored. Sleep apnea requires a checkup and treatment. Give your caregivers your medical history. Give your caregivers observations your family has  made about your sleep.  SYMPTOMS   Not feeling rested in the morning.  Anxiety and restlessness at bedtime.  Difficulty falling and staying asleep. TREATMENT   Your caregiver may prescribe treatment for an underlying medical disorders. Your caregiver can give advice or help if you are using alcohol or other drugs for self-medication. Treatment of underlying problems will usually eliminate insomnia problems.  Medications can be prescribed for short time use. They are generally not recommended for lengthy use.  Over-the-counter sleep medicines are not recommended for lengthy use. They can be habit forming.  You can promote easier sleeping by making lifestyle changes such as:  Using relaxation techniques that help with breathing and reduce muscle tension.  Exercising earlier in the day.  Changing your diet and the time of your last meal. No night time snacks.  Establish a regular time to go to bed.  Counseling can help with stressful problems and worry.  Soothing music and white noise may be helpful if there are background noises you cannot remove.  Stop tedious detailed work at least one hour before bedtime. HOME CARE INSTRUCTIONS   Keep a diary. Inform your caregiver about your progress. This includes any medication side effects. See your caregiver regularly. Take note of:  Times when you are asleep.  Times when you are awake during the night.  The quality of your sleep.  How you feel the next day. This information will help your caregiver care for you.  Get out of bed if you are still awake after 15 minutes. Read or do some quiet activity. Keep the lights down. Wait until you feel sleepy and go back to  bed.  Keep regular sleeping and waking hours. Avoid naps.  Exercise regularly.  Avoid distractions at bedtime. Distractions include watching television or engaging in any intense or detailed activity like attempting to balance the household checkbook.  Develop a  bedtime ritual. Keep a familiar routine of bathing, brushing your teeth, climbing into bed at the same time each night, listening to soothing music. Routines increase the success of falling to sleep faster.  Use relaxation techniques. This can be using breathing and muscle tension release routines. It can also include visualizing peaceful scenes. You can also help control troubling or intruding thoughts by keeping your mind occupied with boring or repetitive thoughts like the old concept of counting sheep. You can make it more creative like imagining planting one beautiful flower after another in your backyard garden.  During your day, work to eliminate stress. When this is not possible use some of the previous suggestions to help reduce the anxiety that accompanies stressful situations. MAKE SURE YOU:   Understand these instructions.  Will watch your condition.  Will get help right away if you are not doing well or get worse. Document Released: 12/06/2000 Document Revised: 03/02/2012 Document Reviewed: 01/06/2008 [facility] Patient Information 2015 [place], [place]. This information is not intended to replace advice given to you by your health care provider. Make sure you discuss any questions you have with your health care provider.

## 2015-07-31 NOTE — Progress Notes (Signed)
Pre visit review using our clinic review tool, if applicable. No additional management support is needed unless otherwise documented below in the visit note. 

## 2015-08-01 LAB — COMPREHENSIVE METABOLIC PANEL
ALBUMIN: 4.4 g/dL (ref 3.5–5.2)
ALT: 31 U/L (ref 0–53)
AST: 30 U/L (ref 0–37)
Alkaline Phosphatase: 87 U/L (ref 39–117)
BUN: 10 mg/dL (ref 6–23)
CALCIUM: 9.8 mg/dL (ref 8.4–10.5)
CO2: 27 mEq/L (ref 19–32)
Chloride: 99 mEq/L (ref 96–112)
Creatinine, Ser: 0.98 mg/dL (ref 0.40–1.50)
GFR: 92.24 mL/min (ref >60.00)
GLUCOSE: 79 mg/dL (ref 70–99)
Potassium: 4.6 mEq/L (ref 3.5–5.1)
Sodium: 134 mEq/L — ABNORMAL LOW (ref 135–145)
Total Bilirubin: 0.9 mg/dL (ref 0.2–1.2)
Total Protein: 7.6 g/dL (ref 6.0–8.3)

## 2015-08-01 LAB — LIPID PANEL
Cholesterol: 133 mg/dL (ref 0–200)
HDL: 66.9 mg/dL (ref >39.00)
LDL CALC: 49 mg/dL (ref 0–99)
NonHDL: 65.76
TRIGLYCERIDES: 86 mg/dL (ref 0.0–149.0)
Total CHOL/HDL Ratio: 2
VLDL: 17.2 mg/dL (ref 0.0–40.0)

## 2015-08-01 LAB — HEPATITIS PANEL, ACUTE
HCV Ab: NEGATIVE
HEP B C IGM: NONREACTIVE
Hep A IgM: NONREACTIVE
Hepatitis B Surface Ag: NEGATIVE

## 2015-08-01 LAB — TSH: TSH: 1.17 u[IU]/mL (ref 0.35–4.50)

## 2015-08-13 ENCOUNTER — Encounter: Payer: Self-pay | Admitting: Family Medicine

## 2015-08-13 DIAGNOSIS — M720 Palmar fascial fibromatosis [Dupuytren]: Secondary | ICD-10-CM

## 2015-08-13 DIAGNOSIS — R509 Fever, unspecified: Secondary | ICD-10-CM | POA: Insufficient documentation

## 2015-08-13 DIAGNOSIS — R05 Cough: Secondary | ICD-10-CM | POA: Insufficient documentation

## 2015-08-13 DIAGNOSIS — R059 Cough, unspecified: Secondary | ICD-10-CM | POA: Insufficient documentation

## 2015-08-13 DIAGNOSIS — F4323 Adjustment disorder with mixed anxiety and depressed mood: Secondary | ICD-10-CM | POA: Insufficient documentation

## 2015-08-13 HISTORY — DX: Palmar fascial fibromatosis (dupuytren): M72.0

## 2015-08-13 NOTE — Assessment & Plan Note (Signed)
Normalized. Minimize simple carbs 

## 2015-08-13 NOTE — Assessment & Plan Note (Signed)
Recent surgery on right hand performed by Dr Amedeo Plenty, still healing.

## 2015-08-13 NOTE — Assessment & Plan Note (Signed)
Acceptable at this time. No change in meds.

## 2015-08-13 NOTE — Assessment & Plan Note (Signed)
Patient encouraged to maintain heart healthy diet, regular exercise, adequate sleep. Consider daily probiotics. Take medications as prescribed. Patient encouraged to maintain heart healthy diet, regular exercise, adequate sleep. Consider daily probiotics. Take medications as prescribed 

## 2015-08-13 NOTE — Assessment & Plan Note (Signed)
Started on Doxycycline. Encouraged increased rest and hydration, add probiotics, zinc such as Coldeze or Xicam. Treat fevers as needed

## 2015-08-13 NOTE — Assessment & Plan Note (Signed)
Encouraged good sleep hygiene such as dark, quiet room. No blue/green glowing lights such as computer screens in bedroom. No alcohol or stimulants in evening. Cut down on caffeine as able. Regular exercise is helpful but not just prior to bed time.  
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
## 2015-11-23 ENCOUNTER — Ambulatory Visit (INDEPENDENT_AMBULATORY_CARE_PROVIDER_SITE_OTHER): Payer: Managed Care, Other (non HMO) | Admitting: Family Medicine

## 2015-11-23 ENCOUNTER — Encounter: Payer: Self-pay | Admitting: Family Medicine

## 2015-11-23 ENCOUNTER — Other Ambulatory Visit (HOSPITAL_COMMUNITY)
Admission: RE | Admit: 2015-11-23 | Discharge: 2015-11-23 | Disposition: A | Discharge status: Home / Self Care | Payer: Managed Care, Other (non HMO) | Source: Ambulatory Visit | Attending: Family Medicine | Admitting: Family Medicine

## 2015-11-23 VITALS — BP 130/83 | HR 72 | Temp 98.0°F | Ht 70.0 in | Wt 172.5 lb

## 2015-11-23 DIAGNOSIS — E871 Hypo-osmolality and hyponatremia: Secondary | ICD-10-CM

## 2015-11-23 DIAGNOSIS — Z113 Encounter for screening for infections with a predominantly sexual mode of transmission: Secondary | ICD-10-CM | POA: Diagnosis not present

## 2015-11-23 DIAGNOSIS — R059 Cough, unspecified: Secondary | ICD-10-CM

## 2015-11-23 DIAGNOSIS — F4323 Adjustment disorder with mixed anxiety and depressed mood: Secondary | ICD-10-CM

## 2015-11-23 DIAGNOSIS — Z0000 Encounter for general adult medical examination without abnormal findings: Secondary | ICD-10-CM | POA: Diagnosis not present

## 2015-11-23 DIAGNOSIS — K7689 Other specified diseases of liver: Secondary | ICD-10-CM

## 2015-11-23 DIAGNOSIS — G4700 Insomnia, unspecified: Secondary | ICD-10-CM

## 2015-11-23 DIAGNOSIS — Z7251 High risk heterosexual behavior: Secondary | ICD-10-CM

## 2015-11-23 DIAGNOSIS — R05 Cough: Secondary | ICD-10-CM

## 2015-11-23 DIAGNOSIS — R945 Abnormal results of liver function studies: Secondary | ICD-10-CM

## 2015-11-23 DIAGNOSIS — R509 Fever, unspecified: Secondary | ICD-10-CM

## 2015-11-23 LAB — COMPREHENSIVE METABOLIC PANEL
ALT: 58 U/L — ABNORMAL HIGH (ref 0–53)
AST: 37 U/L (ref 0–37)
Albumin: 4.3 g/dL (ref 3.5–5.2)
Alkaline Phosphatase: 103 U/L (ref 39–117)
BILIRUBIN TOTAL: 0.7 mg/dL (ref 0.2–1.2)
BUN: 15 mg/dL (ref 6–23)
CO2: 29 mEq/L (ref 19–32)
Calcium: 9.2 mg/dL (ref 8.4–10.5)
Chloride: 102 mEq/L (ref 96–112)
Creatinine, Ser: 0.91 mg/dL (ref 0.40–1.50)
GFR: 100.3 mL/min (ref >60.00)
GLUCOSE: 76 mg/dL (ref 70–99)
POTASSIUM: 4.5 meq/L (ref 3.5–5.1)
SODIUM: 138 meq/L (ref 135–145)
Total Protein: 7.4 g/dL (ref 6.0–8.3)

## 2015-11-23 LAB — RPR

## 2015-11-23 MED ORDER — ALPRAZOLAM 0.25 MG PO TABS: ORAL_TABLET | ORAL | Status: DC

## 2015-11-23 NOTE — Progress Notes (Signed)
Pre visit review using our clinic review tool, if applicable. No additional management support is needed unless otherwise documented below in the visit note. 
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
## 2015-11-23 NOTE — Patient Instructions (Signed)
Hyponatremia °Hyponatremia is when the amount of salt (sodium) in your blood is too low. When sodium levels are low, your cells absorb extra water and they swell. The swelling happens throughout the body, but it mostly affects the brain. °CAUSES °This condition may be caused by: °· Heart, kidney, or liver problems. °· Thyroid problems. °· Adrenal gland problems. °· Metabolic conditions, such as syndrome of inappropriate antidiuretic hormone (SIADH). °· Severe vomiting and diarrhea. °· Certain medicines or illegal drugs. °· Dehydration. °· Drinking too much water. °· Eating a diet that is low in sodium. °· Large burns on your body. °· Sweating. °RISK FACTORS °This condition is more likely to develop in people who: °· Have long-term (chronic) kidney disease. °· Have heart failure. °· Have a medical condition that causes frequent or excessive diarrhea. °· Have metabolic conditions, such as Addison disease or SIADH. °· Take certain medicines that affect the sodium and fluid balance in the blood. Some of these medicine types include: °¨ Diuretics. °¨ NSAIDs. °¨ Some opioid pain medicines. °¨ Some antidepressants. °¨ Some seizure prevention medicines. °SYMPTOMS  °Symptoms of this condition include: °· Nausea and vomiting. °· Confusion. °· Lethargy. °· Agitation. °· Headache. °· Seizures. °· Unconsciousness. °· Appetite loss. °· Muscle weakness and cramping. °· Feeling weak or light-headed. °· Having a rapid heart rate. °· Fainting, in severe cases. °DIAGNOSIS °This condition is diagnosed with a medical history and physical exam. You will also have other tests, including: °· Blood tests. °· Urine tests. °TREATMENT °Treatment for this condition depends on the cause. Treatment may include: °· Fluids given through an IV tube that is inserted into one of your veins. °· Medicines to correct the sodium imbalance. If medicines are causing the condition, the medicines will need to be adjusted. °· Limiting water or fluid intake to  get the correct sodium balance. °HOME CARE INSTRUCTIONS °· Take medicines only as directed by your health care provider. Many medicines can make this condition worse. Talk with your health care provider about any medicines that you are currently taking. °· Carefully follow a recommended diet as directed by your health care provider. °· Carefully follow instructions from your health care provider about fluid restrictions. °· Keep all follow-up visits as directed by your health care provider. This is important. °· Do not drink alcohol. °SEEK MEDICAL CARE IF: °· You develop worsening nausea, fatigue, headache, confusion, or weakness. °· Your symptoms go away and then return. °· You have problems following the recommended diet. °SEEK IMMEDIATE MEDICAL CARE IF: °· You have a seizure. °· You faint. °· You have ongoing diarrhea or vomiting. °  °This information is not intended to replace advice given to you by your health care provider. Make sure you discuss any questions you have with your health care provider. °  °Document Released: 11/29/2002 Document Revised: 04/25/2015 Document Reviewed: 12/29/2014 °Elsevier Interactive Patient Education ©2016 Else[employer] Inc[employer] ° ° ° °

## 2015-11-24 LAB — URINE CYTOLOGY ANCILLARY ONLY
CHLAMYDIA, DNA PROBE: NEGATIVE
Neisseria Gonorrhea: NEGATIVE
Trichomonas: NEGATIVE

## 2015-11-24 LAB — HIV ANTIBODY (ROUTINE TESTING W REFLEX): HIV 1&2 Ab, 4th Generation: NONREACTIVE

## 2015-11-26 NOTE — Assessment & Plan Note (Signed)
Patient has a new partner and is requesting STD testings as a precaution. No discharge or lesions but has not used a barrier method consistently. All testing negative
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
## 2015-11-26 NOTE — Assessment & Plan Note (Signed)
Doing fairly well. No recent concerns.

## 2015-11-26 NOTE — Assessment & Plan Note (Signed)
Encouraged good sleep hygiene such as dark, quiet room. No blue/green glowing lights such as computer screens in bedroom. No alcohol or stimulants in evening. Cut down on caffeine as able. Regular exercise is helpful but not just prior to bed time.  

## 2015-11-27 LAB — URINE CYTOLOGY ANCILLARY ONLY: BACTERIAL VAGINITIS: NEGATIVE

## 2016-02-01 ENCOUNTER — Ambulatory Visit: Payer: Managed Care, Other (non HMO) | Admitting: Family Medicine

## 2016-02-02 ENCOUNTER — Encounter: Payer: Self-pay | Admitting: Family Medicine

## 2016-02-02 ENCOUNTER — Ambulatory Visit (INDEPENDENT_AMBULATORY_CARE_PROVIDER_SITE_OTHER): Payer: Managed Care, Other (non HMO) | Admitting: Family Medicine

## 2016-02-02 VITALS — BP 108/71 | HR 65 | Temp 98.1°F | Ht 70.0 in | Wt 178.0 lb

## 2016-02-02 DIAGNOSIS — Z0000 Encounter for general adult medical examination without abnormal findings: Secondary | ICD-10-CM

## 2016-02-02 DIAGNOSIS — Z23 Encounter for immunization: Secondary | ICD-10-CM | POA: Diagnosis not present

## 2016-02-02 DIAGNOSIS — R945 Abnormal results of liver function studies: Secondary | ICD-10-CM

## 2016-02-02 DIAGNOSIS — F329 Major depressive disorder, single episode, unspecified: Secondary | ICD-10-CM

## 2016-02-02 DIAGNOSIS — R05 Cough: Secondary | ICD-10-CM | POA: Diagnosis not present

## 2016-02-02 DIAGNOSIS — F4323 Adjustment disorder with mixed anxiety and depressed mood: Secondary | ICD-10-CM

## 2016-02-02 DIAGNOSIS — K7689 Other specified diseases of liver: Secondary | ICD-10-CM

## 2016-02-02 DIAGNOSIS — R059 Cough, unspecified: Secondary | ICD-10-CM

## 2016-02-02 DIAGNOSIS — F419 Anxiety disorder, unspecified: Secondary | ICD-10-CM

## 2016-02-02 DIAGNOSIS — F418 Other specified anxiety disorders: Secondary | ICD-10-CM | POA: Diagnosis not present

## 2016-02-02 DIAGNOSIS — G4700 Insomnia, unspecified: Secondary | ICD-10-CM | POA: Diagnosis not present

## 2016-02-02 DIAGNOSIS — F32A Depression, unspecified: Secondary | ICD-10-CM

## 2016-02-02 DIAGNOSIS — L659 Nonscarring hair loss, unspecified: Secondary | ICD-10-CM

## 2016-02-02 DIAGNOSIS — R509 Fever, unspecified: Secondary | ICD-10-CM | POA: Diagnosis not present

## 2016-02-02 LAB — COMPREHENSIVE METABOLIC PANEL
ALT: 129 U/L — AB (ref 0–53)
AST: 65 U/L — AB (ref 0–37)
Albumin: 4.3 g/dL (ref 3.5–5.2)
Alkaline Phosphatase: 92 U/L (ref 39–117)
BILIRUBIN TOTAL: 0.6 mg/dL (ref 0.2–1.2)
BUN: 11 mg/dL (ref 6–23)
CO2: 32 meq/L (ref 19–32)
CREATININE: 0.92 mg/dL (ref 0.40–1.50)
Calcium: 9.4 mg/dL (ref 8.4–10.5)
Chloride: 103 mEq/L (ref 96–112)
GFR: 98.94 mL/min (ref >60.00)
GLUCOSE: 85 mg/dL (ref 70–99)
Potassium: 4.3 mEq/L (ref 3.5–5.1)
SODIUM: 139 meq/L (ref 135–145)
Total Protein: 7.2 g/dL (ref 6.0–8.3)

## 2016-02-02 MED ORDER — ALPRAZOLAM 0.25 MG PO TABS: ORAL_TABLET | ORAL | Status: DC

## 2016-02-02 NOTE — Progress Notes (Signed)
Pre visit review using our clinic review tool, if applicable. No additional management support is needed unless otherwise documented below in the visit note. 

## 2016-02-02 NOTE — Patient Instructions (Addendum)
Salonpas gel or aspercreme  Encouraged good sleep hygiene such as dark, quiet room. No blue/green glowing lights such as computer screens in bedroom. No alcohol or stimulants in evening. Cut down on caffeine as able. Regular exercise is helpful but not just prior to bed time. L tryptophan   Insomnia Insomnia is a sleep disorder that makes it difficult to fall asleep or to stay asleep. Insomnia can cause tiredness (fatigue), low energy, difficulty concentrating, mood swings, and poor performance at work or school.  There are three different ways to classify insomnia:  Difficulty falling asleep.  Difficulty staying asleep.  Waking up too early in the morning. Any type of insomnia can be long-term (chronic) or short-term (acute). Both are common. Short-term insomnia usually lasts for three months or less. Chronic insomnia occurs at least three times a week for longer than three months. CAUSES  Insomnia may be caused by another condition, situation, or substance, such as:  Anxiety.  Certain medicines.  Gastroesophageal reflux disease (GERD) or other gastrointestinal conditions.  Asthma or other breathing conditions.  Restless legs syndrome, sleep apnea, or other sleep disorders.  Chronic pain.  Menopause. This may include hot flashes.  Stroke.  Abuse of alcohol, tobacco, or illegal drugs.  Depression.  Caffeine.   Neurological disorders, such as Alzheimer disease.  An overactive thyroid (hyperthyroidism). The cause of insomnia may not be known. RISK FACTORS Risk factors for insomnia include:  Gender. Women are more commonly affected than men.  Age. Insomnia is more common as you get older.  Stress. This may involve your professional or personal life.  Income. Insomnia is more common in people with lower income.  Lack of exercise.   Irregular work schedule or night shifts.  Traveling between different time zones. SIGNS AND SYMPTOMS If you have insomnia,  trouble falling asleep or trouble staying asleep is the main symptom. This may lead to other symptoms, such as:  Feeling fatigued.  Feeling nervous about going to sleep.  Not feeling rested in the morning.  Having trouble concentrating.  Feeling irritable, anxious, or depressed. TREATMENT  Treatment for insomnia depends on the cause. If your insomnia is caused by an underlying condition, treatment will focus on addressing the condition. Treatment may also include:   Medicines to help you sleep.  Counseling or therapy.  Lifestyle adjustments. HOME CARE INSTRUCTIONS   Take medicines only as directed by your health care provider.  Keep regular sleeping and waking hours. Avoid naps.  Keep a sleep diary to help you and your health care provider figure out what could be causing your insomnia. Include:   When you sleep.  When you wake up during the night.  How well you sleep.   How rested you feel the next day.  Any side effects of medicines you are taking.  What you eat and drink.   Make your bedroom a comfortable place where it is easy to fall asleep:  Put up shades or special blackout curtains to block light from outside.  Use a white noise machine to block noise.  Keep the temperature cool.   Exercise regularly as directed by your health care provider. Avoid exercising right before bedtime.  Use relaxation techniques to manage stress. Ask your health care provider to suggest some techniques that may work well for you. These may include:  Breathing exercises.  Routines to release muscle tension.  Visualizing peaceful scenes.  Cut back on alcohol, caffeinated beverages, and cigarettes, especially close to bedtime. These can disrupt your  sleep.  Do not overeat or eat spicy foods right before bedtime. This can lead to digestive discomfort that can make it hard for you to sleep.  Limit screen use before bedtime. This includes:  Watching TV.  Using your  smartphone, tablet, and computer.  Stick to a routine. This can help you fall asleep faster. Try to do a quiet activity, brush your teeth, and go to bed at the same time each night.  Get out of bed if you are still awake after 15 minutes of trying to sleep. Keep the lights down, but try reading or doing a quiet activity. When you feel sleepy, go back to bed.  Make sure that you drive carefully. Avoid driving if you feel very sleepy.  Keep all follow-up appointments as directed by your health care provider. This is important. SEEK MEDICAL CARE IF:   You are tired throughout the day or have trouble in your daily routine due to sleepiness.  You continue to have sleep problems or your sleep problems get worse. SEEK IMMEDIATE MEDICAL CARE IF:   You have serious thoughts about hurting yourself or someone else.   This information is not intended to replace advice given to you by your health care provider. Make sure you discuss any questions you have with your health care provider.   Document Released: 12/06/2000 Document Revised: 08/30/2015 Document Reviewed: 09/09/2014 Elsevier Interactive Patient Education [employer].
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
## 2016-02-05 ENCOUNTER — Other Ambulatory Visit: Payer: Self-pay | Admitting: Family Medicine

## 2016-02-05 ENCOUNTER — Encounter: Payer: Self-pay | Admitting: Family Medicine

## 2016-02-05 DIAGNOSIS — R7989 Other specified abnormal findings of blood chemistry: Secondary | ICD-10-CM

## 2016-02-05 DIAGNOSIS — R945 Abnormal results of liver function studies: Secondary | ICD-10-CM

## 2016-02-08 ENCOUNTER — Other Ambulatory Visit: Payer: Self-pay | Admitting: Family Medicine

## 2016-02-08 ENCOUNTER — Ambulatory Visit (HOSPITAL_BASED_OUTPATIENT_CLINIC_OR_DEPARTMENT_OTHER): Payer: Managed Care, Other (non HMO)

## 2016-02-08 DIAGNOSIS — R945 Abnormal results of liver function studies: Secondary | ICD-10-CM

## 2016-02-11 ENCOUNTER — Encounter: Payer: Self-pay | Admitting: Family Medicine

## 2016-02-11 NOTE — Assessment & Plan Note (Signed)
Increased will proceed with ultrasound and is encouraged to minimize OTC meds and alcohol intake

## 2016-02-11 NOTE — Assessment & Plan Note (Signed)
Tolerating Finasteride. May continue

## 2016-02-11 NOTE — Assessment & Plan Note (Signed)
Doing well on intermittent Alprazolam use. Will consider further meds if worsens.

## 2016-03-13 ENCOUNTER — Other Ambulatory Visit: Payer: Managed Care, Other (non HMO)

## 2016-04-11 ENCOUNTER — Other Ambulatory Visit: Payer: Managed Care, Other (non HMO)

## 2016-04-17 ENCOUNTER — Encounter: Payer: Self-pay | Admitting: Medical

## 2016-04-17 ENCOUNTER — Ambulatory Visit (INDEPENDENT_AMBULATORY_CARE_PROVIDER_SITE_OTHER): Payer: Managed Care, Other (non HMO) | Admitting: Medical

## 2016-04-17 VITALS — BP 120/78 | HR 68 | Temp 97.8°F | Resp 14 | Ht 70.0 in | Wt 181.0 lb

## 2016-04-17 DIAGNOSIS — T7840XA Allergy, unspecified, initial encounter: Secondary | ICD-10-CM | POA: Diagnosis not present

## 2016-04-17 DIAGNOSIS — R21 Rash and other nonspecific skin eruption: Secondary | ICD-10-CM

## 2016-04-17 MED ORDER — FAMCICLOVIR 500 MG PO TABS: 500.0000 mg | ORAL_TABLET | Freq: Three times a day (TID) | ORAL | Status: DC

## 2016-04-17 MED ORDER — HYDROXYZINE HCL 25 MG PO TABS: 25.0000 mg | ORAL_TABLET | Freq: Three times a day (TID) | ORAL | Status: DC | PRN

## 2016-04-17 MED ORDER — PREDNISONE 10 MG PO TABS: ORAL_TABLET | ORAL | Status: DC

## 2016-04-17 NOTE — Patient Instructions (Addendum)
Your rash may represent allergic reaction but cause is unclear. But also location and being unilateral  causes some concern for shingles. Since limited time frame for shingles treatment  will go ahead and rx famvir.  For possible allergic reaction rx tapered prednisone and hydroxyzine.  Follow up in 7 days or as needed.  If rash worsens or any eye symptoms then ED evaluation.
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
## 2016-04-17 NOTE — Progress Notes (Signed)
Pre visit review using our clinic review tool, if applicable. No additional management support is needed unless otherwise documented below in the visit note. 

## 2016-05-23 ENCOUNTER — Ambulatory Visit (INDEPENDENT_AMBULATORY_CARE_PROVIDER_SITE_OTHER): Payer: Managed Care, Other (non HMO) | Admitting: Family Medicine

## 2016-05-23 ENCOUNTER — Encounter: Payer: Self-pay | Admitting: Family Medicine

## 2016-05-23 VITALS — BP 112/78 | HR 82 | Temp 98.3°F | Ht 70.0 in | Wt 176.4 lb

## 2016-05-23 DIAGNOSIS — F419 Anxiety disorder, unspecified: Secondary | ICD-10-CM

## 2016-05-23 DIAGNOSIS — K429 Umbilical hernia without obstruction or gangrene: Secondary | ICD-10-CM

## 2016-05-23 DIAGNOSIS — K7689 Other specified diseases of liver: Secondary | ICD-10-CM

## 2016-05-23 DIAGNOSIS — R7989 Other specified abnormal findings of blood chemistry: Secondary | ICD-10-CM | POA: Diagnosis not present

## 2016-05-23 DIAGNOSIS — F32A Depression, unspecified: Secondary | ICD-10-CM

## 2016-05-23 DIAGNOSIS — F418 Other specified anxiety disorders: Secondary | ICD-10-CM | POA: Diagnosis not present

## 2016-05-23 DIAGNOSIS — F329 Major depressive disorder, single episode, unspecified: Secondary | ICD-10-CM

## 2016-05-23 DIAGNOSIS — L719 Rosacea, unspecified: Secondary | ICD-10-CM

## 2016-05-23 DIAGNOSIS — R945 Abnormal results of liver function studies: Secondary | ICD-10-CM

## 2016-05-23 LAB — COMPREHENSIVE METABOLIC PANEL
ALBUMIN: 4.4 g/dL (ref 3.5–5.2)
ALT: 29 U/L (ref 0–53)
AST: 26 U/L (ref 0–37)
Alkaline Phosphatase: 68 U/L (ref 39–117)
BUN: 15 mg/dL (ref 6–23)
CALCIUM: 9.8 mg/dL (ref 8.4–10.5)
CHLORIDE: 102 meq/L (ref 96–112)
CO2: 31 mEq/L (ref 19–32)
Creatinine, Ser: 1.04 mg/dL (ref 0.40–1.50)
GFR: 85.74 mL/min (ref >60.00)
Glucose, Bld: 93 mg/dL (ref 70–99)
POTASSIUM: 4.4 meq/L (ref 3.5–5.1)
SODIUM: 137 meq/L (ref 135–145)
Total Bilirubin: 0.7 mg/dL (ref 0.2–1.2)
Total Protein: 7.2 g/dL (ref 6.0–8.3)

## 2016-05-23 MED ORDER — METRONIDAZOLE 1 % EX GEL: Freq: Every day | CUTANEOUS | Status: DC | PRN

## 2016-05-23 NOTE — Patient Instructions (Addendum)
Basic Carbohydrate Counting  Carbohydrate counting is a method for keeping track of the amount of carbohydrates you eat. Eating carbohydrates naturally increases the level of sugar (glucose) in your blood, so it is important for you to know the amount that is okay for you to have in every meal. Carbohydrate counting helps keep the level of glucose in your blood within normal limits. The amount of carbohydrates allowed is different for every person. A dietitian can help you calculate the amount that is right for you. Once you know the amount of carbohydrates you can have, you can count the carbohydrates in the foods you want to eat. Carbohydrates are found in the following foods:  Grains, such as breads and cereals.  Dried beans and soy products.  Starchy vegetables, such as potatoes, peas, and corn.  Fruit and fruit juices.  Milk and yogurt.  Sweets and snack foods, such as cake, cookies, candy, chips, soft drinks, and fruit drinks. CARBOHYDRATE COUNTING There are two ways to count the carbohydrates in your food. You can use either of the methods or a combination of both. Reading the "Nutrition Facts" on [place] The "Nutrition Facts" is an area that is included on the labels of almost all packaged food and beverages in the [place]. It includes the serving size of that food or beverage and information about the nutrients in each serving of the food, including the grams (g) of carbohydrate per serving.  Decide the number of servings of this food or beverage that you will be able to eat or drink. Multiply that number of servings by the number of grams of carbohydrate that is listed on the label for that serving. The total will be the amount of carbohydrates you will be having when you eat or drink this food or beverage. Learning Standard Serving Sizes of Food When you eat food that is not packaged or does not include "Nutrition Facts" on the label, you need to measure the servings in  order to count the amount of carbohydrates.A serving of most carbohydrate-rich foods contains about 15 g of carbohydrates. The following list includes serving sizes of carbohydrate-rich foods that provide 15 g ofcarbohydrate per serving:   1 slice of bread (1 oz) or 1 six-inch tortilla.    of a hamburger bun or English muffin.  4-6 crackers.   cup unsweetened dry cereal.    cup hot cereal.   cup rice or pasta.    cup mashed potatoes or  of a large baked potato.  1 cup fresh fruit or one small piece of fruit.    cup canned or frozen fruit or fruit juice.  1 cup milk.   cup plain fat-free yogurt or yogurt sweetened with artificial sweeteners.   cup cooked dried beans or starchy vegetable, such as peas, corn, or potatoes.  Decide the number of standard-size servings that you will eat. Multiply that number of servings by 15 (the grams of carbohydrates in that serving). For example, if you eat 2 cups of strawberries, you will have eaten 2 servings and 30 g of carbohydrates (2 servings x 15 g = 30 g). For foods such as soups and casseroles, in which more than one food is mixed in, you will need to count the carbohydrates in each food that is included. EXAMPLE OF CARBOHYDRATE COUNTING Sample Dinner  3 oz chicken breast.   cup of brown rice.   cup of corn.  1 cup milk.   1 cup strawberries with sugar-free whipped  topping.  Carbohydrate Calculation Step 1: Identify the foods that contain carbohydrates:   Rice.   Corn.   Milk.   Strawberries. Step 2:Calculate the number of servings eaten of each:   2 servings of rice.   1 serving of corn.   1 serving of milk.   1 serving of strawberries. Step 3: Multiply each of those number of servings by 15 g:   2 servings of rice x 15 g = 30 g.   1 serving of corn x 15 g = 15 g.   1 serving of milk x 15 g = 15 g.   1 serving of strawberries x 15 g = 15 g. Step 4: Add together all of the amounts  to find the total grams of carbohydrates eaten: 30 g + 15 g + 15 g + 15 g = 75 g.   This information is not intended to replace advice given to you by your health care provider. Make sure you discuss any questions you have with your health care provider.   Document Released: 12/09/2005 Document Revised: 12/30/2014 Document Reviewed: 11/05/2013 Elsevier Interactive Patient Education 2016 Elsevier Inc. Ventral Hernia A ventral hernia (also called an incisional hernia) is a hernia that occurs at the site of a previous surgical cut (incision) in the abdomen. The abdominal wall spans from your lower chest down to your pelvis. If the abdominal wall is weakened from a surgical incision, a hernia can occur. A hernia is a bulge of bowel or muscle tissue pushing out on the weakened part of the abdominal wall. Ventral hernias can get bigger from straining or lifting. Obese and older people are at higher risk for a ventral hernia. People who develop infections after surgery or require repeat incisions at the same site on the abdomen are also at increased risk. CAUSES  A ventral hernia occurs because of weakness in the abdominal wall at an incision site.  SYMPTOMS  Common symptoms include:  A visible bulge or lump on the abdominal wall.  Pain or tenderness around the lump.  Increased discomfort if you cough or make a sudden movement. If the hernia has blocked part of the intestine, a serious complication can occur (incarcerated or strangulated hernia). This can become a problem that requires emergency surgery because the blood flow to the blocked intestine may be cut off. Symptoms may include:  Feeling sick to your stomach (nauseous).  Throwing up (vomiting).  Stomach swelling (distention) or bloating.  Fever.  Rapid heartbeat. DIAGNOSIS  Your health care provider will take a medical history and perform a physical exam. Various tests may be ordered, such as:  Blood tests.  Urine  tests.  Ultrasonography.  X-rays.  Computed tomography (CT). TREATMENT  Watchful waiting may be all that is needed for a smaller hernia that does not cause symptoms. Your health care provider may recommend the use of a supportive belt (truss) that helps to keep the abdominal wall intact. For larger hernias or those that cause pain, surgery to repair the hernia is usually recommended. If a hernia becomes strangulated, emergency surgery needs to be done right away. HOME CARE INSTRUCTIONS  Avoid putting pressure or strain on the abdominal area.  Avoid heavy lifting.  Use good body positioning for physical tasks. Ask your health care provider about proper body positioning.  Use a supportive belt as directed by your health care provider.  Maintain a healthy weight.  Eat foods that are high in fiber, such as whole grains, fruits, and  vegetables. Fiber helps prevent difficult bowel movements (constipation).  Drink enough fluids to keep your urine clear or pale yellow.  Follow up with your health care provider as directed. SEEK MEDICAL CARE IF:   Your hernia seems to be getting larger or more painful. SEEK IMMEDIATE MEDICAL CARE IF:   You have abdominal pain that is sudden and sharp.  Your pain becomes severe.  You have repeated vomiting.  You are sweating a lot.  You notice a rapid heartbeat.  You develop a fever. MAKE SURE YOU:   Understand these instructions.  Will watch your condition.  Will get help right away if you are not doing well or get worse.   This information is not intended to replace advice given to you by your health care provider. Make sure you discuss any questions you have with your health care provider.   Document Released: 11/25/2012 Document Revised: 12/30/2014 Document Reviewed: 11/25/2012 Elsevier Interactive Patient Education [employer].

## 2016-05-23 NOTE — Progress Notes (Signed)
Pre visit review using our clinic review tool, if applicable. No additional management support is needed unless otherwise documented below in the visit note. 

## 2016-05-24 ENCOUNTER — Ambulatory Visit (HOSPITAL_BASED_OUTPATIENT_CLINIC_OR_DEPARTMENT_OTHER)
Admission: RE | Admit: 2016-05-24 | Discharge: 2016-05-24 | Disposition: A | Discharge status: Home / Self Care | Payer: Managed Care, Other (non HMO) | Source: Ambulatory Visit | Attending: Family Medicine | Admitting: Family Medicine

## 2016-05-24 DIAGNOSIS — K760 Fatty (change of) liver, not elsewhere classified: Secondary | ICD-10-CM | POA: Diagnosis not present

## 2016-05-24 DIAGNOSIS — R7989 Other specified abnormal findings of blood chemistry: Secondary | ICD-10-CM | POA: Diagnosis not present

## 2016-05-24 DIAGNOSIS — K429 Umbilical hernia without obstruction or gangrene: Secondary | ICD-10-CM

## 2016-05-24 DIAGNOSIS — K439 Ventral hernia without obstruction or gangrene: Secondary | ICD-10-CM | POA: Insufficient documentation

## 2016-05-24 DIAGNOSIS — R945 Abnormal results of liver function studies: Secondary | ICD-10-CM

## 2016-05-24 MED ORDER — IOPAMIDOL (ISOVUE-300) INJECTION 61%
100.0000 mL | Freq: Once | INTRAVENOUS | Status: AC | PRN
  Administered 2016-05-24: 100 mL via INTRAVENOUS

## 2016-05-26 ENCOUNTER — Other Ambulatory Visit: Payer: Self-pay | Admitting: Family Medicine

## 2016-05-26 DIAGNOSIS — K439 Ventral hernia without obstruction or gangrene: Secondary | ICD-10-CM

## 2016-05-28 ENCOUNTER — Encounter: Payer: Self-pay | Admitting: Family Medicine

## 2016-06-02 ENCOUNTER — Encounter: Payer: Self-pay | Admitting: Family Medicine

## 2016-06-02 DIAGNOSIS — K429 Umbilical hernia without obstruction or gangrene: Secondary | ICD-10-CM | POA: Insufficient documentation

## 2016-06-02 DIAGNOSIS — L719 Rosacea, unspecified: Secondary | ICD-10-CM

## 2016-06-02 HISTORY — DX: Rosacea, unspecified: L71.9
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
## 2016-06-02 NOTE — Assessment & Plan Note (Signed)
Improved with diet and lifestyle changes will continue to monitor

## 2016-06-02 NOTE — Assessment & Plan Note (Signed)
Patient with symptomatic unbilical hernia on exam. CT scan confirms small umbilical hernia. Will refer to general surgery for further consideration

## 2016-06-02 NOTE — Assessment & Plan Note (Signed)
Doing well at this time  

## 2016-06-02 NOTE — Assessment & Plan Note (Signed)
Describes some episodes of warm, red rash on face. FH of rosacea also noted. Will try to treat next episode with Metrogel and see if that is helpful.

## 2016-08-02 ENCOUNTER — Ambulatory Visit: Payer: Managed Care, Other (non HMO) | Admitting: Family Medicine

## 2016-08-19 ENCOUNTER — Other Ambulatory Visit: Payer: Self-pay | Admitting: Family Medicine

## 2016-08-19 DIAGNOSIS — R05 Cough: Secondary | ICD-10-CM

## 2016-08-19 DIAGNOSIS — Z0000 Encounter for general adult medical examination without abnormal findings: Secondary | ICD-10-CM

## 2016-08-19 DIAGNOSIS — R509 Fever, unspecified: Secondary | ICD-10-CM

## 2016-08-19 DIAGNOSIS — R945 Abnormal results of liver function studies: Secondary | ICD-10-CM

## 2016-08-19 DIAGNOSIS — G4700 Insomnia, unspecified: Secondary | ICD-10-CM

## 2016-08-19 DIAGNOSIS — F4323 Adjustment disorder with mixed anxiety and depressed mood: Secondary | ICD-10-CM

## 2016-08-19 DIAGNOSIS — R059 Cough, unspecified: Secondary | ICD-10-CM

## 2016-08-19 NOTE — Telephone Encounter (Signed)
Faxed hardcopy for Alprazom to

## 2016-08-19 NOTE — Telephone Encounter (Signed)
[facility] [place]

## 2016-08-19 NOTE — Telephone Encounter (Signed)
Requesting:  Alprazolam Contract  Signed on 07/18/2015 UDS   Low, overdue now Last OV   05/23/2016 Last Refill  #30 with 4 refills on 02/02/2016  Please Advise

## 2016-10-28 ENCOUNTER — Other Ambulatory Visit: Payer: Self-pay | Admitting: Family Medicine

## 2016-10-28 DIAGNOSIS — R945 Abnormal results of liver function studies: Secondary | ICD-10-CM

## 2016-10-28 DIAGNOSIS — R05 Cough: Secondary | ICD-10-CM

## 2016-10-28 DIAGNOSIS — R509 Fever, unspecified: Secondary | ICD-10-CM

## 2016-10-28 DIAGNOSIS — G4700 Insomnia, unspecified: Secondary | ICD-10-CM

## 2016-10-28 DIAGNOSIS — F4323 Adjustment disorder with mixed anxiety and depressed mood: Secondary | ICD-10-CM

## 2016-10-28 DIAGNOSIS — Z0000 Encounter for general adult medical examination without abnormal findings: Secondary | ICD-10-CM

## 2016-10-28 DIAGNOSIS — R059 Cough, unspecified: Secondary | ICD-10-CM

## 2016-10-28 NOTE — Telephone Encounter (Signed)
Faxed hardcopy for alprazolam to [facility]

## 2016-11-25 ENCOUNTER — Encounter: Payer: Self-pay | Admitting: Family Medicine

## 2016-11-25 ENCOUNTER — Ambulatory Visit (INDEPENDENT_AMBULATORY_CARE_PROVIDER_SITE_OTHER): Payer: Managed Care, Other (non HMO) | Admitting: Family Medicine

## 2016-11-25 DIAGNOSIS — K7689 Other specified diseases of liver: Secondary | ICD-10-CM | POA: Diagnosis not present

## 2016-11-25 DIAGNOSIS — F419 Anxiety disorder, unspecified: Secondary | ICD-10-CM

## 2016-11-25 DIAGNOSIS — L989 Disorder of the skin and subcutaneous tissue, unspecified: Secondary | ICD-10-CM

## 2016-11-25 DIAGNOSIS — F329 Major depressive disorder, single episode, unspecified: Secondary | ICD-10-CM

## 2016-11-25 DIAGNOSIS — G4700 Insomnia, unspecified: Secondary | ICD-10-CM

## 2016-11-25 DIAGNOSIS — F32A Depression, unspecified: Secondary | ICD-10-CM

## 2016-11-25 DIAGNOSIS — L719 Rosacea, unspecified: Secondary | ICD-10-CM

## 2016-11-25 DIAGNOSIS — Z0000 Encounter for general adult medical examination without abnormal findings: Secondary | ICD-10-CM

## 2016-11-25 DIAGNOSIS — F418 Other specified anxiety disorders: Secondary | ICD-10-CM

## 2016-11-25 DIAGNOSIS — Z0001 Encounter for general adult medical examination with abnormal findings: Secondary | ICD-10-CM

## 2016-11-25 DIAGNOSIS — F4323 Adjustment disorder with mixed anxiety and depressed mood: Secondary | ICD-10-CM

## 2016-11-25 DIAGNOSIS — R059 Cough, unspecified: Secondary | ICD-10-CM

## 2016-11-25 DIAGNOSIS — R509 Fever, unspecified: Secondary | ICD-10-CM

## 2016-11-25 DIAGNOSIS — R945 Abnormal results of liver function studies: Secondary | ICD-10-CM

## 2016-11-25 DIAGNOSIS — R05 Cough: Secondary | ICD-10-CM

## 2016-11-25 HISTORY — DX: Disorder of the skin and subcutaneous tissue, unspecified: L98.9

## 2016-11-25 MED ORDER — ALPRAZOLAM 0.25 MG PO TABS: 0.2500 mg | ORAL_TABLET | Freq: Two times a day (BID) | ORAL | 1 refills | Status: DC | PRN

## 2016-11-25 MED ORDER — CLINDAMYCIN PHOS-BENZOYL PEROX 1-5 % EX GEL
Freq: Two times a day (BID) | CUTANEOUS | 1 refills | Status: DC | PRN
Start: 2016-11-25 — End: 2018-10-27

## 2016-11-25 NOTE — Progress Notes (Signed)
Pre visit review using our clinic review tool, if applicable. No additional management support is needed unless otherwise documented below in the visit note. 

## 2016-11-25 NOTE — Assessment & Plan Note (Signed)
Patient encouraged to maintain heart healthy diet, regular exercise, adequate sleep. Consider daily probiotics. Take medications as prescribed. Given and reviewed copy of ACP documents from [place] Secretary of State and encouraged to complete and return 
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
## 2016-11-25 NOTE — Assessment & Plan Note (Signed)
cmp today 

## 2016-11-25 NOTE — Assessment & Plan Note (Signed)
Lesion on left cheek for a couple of months. Try Benzaclin and witch hazel call for referral if worse

## 2016-11-25 NOTE — Patient Instructions (Addendum)
Witch Hazel astringent  Lidocaine topical, Aspercreme, Salon Pas or [employer].[place] makes L tryptophan and Valerian Root.   Preventive Care 18-39 Years, Male Preventive care refers to lifestyle choices and visits with your health care provider that can promote health and wellness. What does preventive care include?  A yearly physical exam. This is also called an annual well check.  Dental exams once or twice a year.  Routine eye exams. Ask your health care provider how often you should have your eyes checked.  Personal lifestyle choices, including:  Daily care of your teeth and gums.  Regular physical activity.  Eating a healthy diet.  Avoiding tobacco and drug use.  Limiting alcohol use.  Practicing safe sex. What happens during an annual well check? The services and screenings done by your health care provider during your annual well check will depend on your age, overall health, lifestyle risk factors, and family history of disease. Counseling  Your health care provider may ask you questions about your:  Alcohol use.  Tobacco use.  Drug use.  Emotional well-being.  Home and relationship well-being.  Sexual activity.  Eating habits.  Work and work Statistician. Screening  You may have the following tests or measurements:  Height, weight, and BMI.  Blood pressure.  Lipid and cholesterol levels. These may be checked every 5 years starting at age 28.  Diabetes screening. This is done by checking your blood sugar (glucose) after you have not eaten for a while (fasting).  Skin check.  Hepatitis C blood test.  Hepatitis B blood test.  Sexually transmitted disease (STD) testing. Discuss your test results, treatment options, and if necessary, the need for more tests with your health care provider. Vaccines  Your health care provider may recommend certain vaccines, such as:  Influenza vaccine. This is recommended every  year.  Tetanus, diphtheria, and acellular pertussis (Tdap, Td) vaccine. You may need a Td booster every 10 years.  Varicella vaccine. You may need this if you have not been vaccinated.  HPV vaccine. If you are 48 or younger, you may need three doses over 6 months.  Measles, mumps, and rubella (MMR) vaccine. You may need at least one dose of MMR.You may also need a second dose.  Pneumococcal 13-valent conjugate (PCV13) vaccine. You may need this if you have certain conditions and have not been vaccinated.  Pneumococcal polysaccharide (PPSV23) vaccine. You may need one or two doses if you smoke cigarettes or if you have certain conditions.  Meningococcal vaccine. One dose is recommended if you are age 46-21 years and a first-year college student living in a residence hall, or if you have one of several medical conditions. You may also need additional booster doses.  Hepatitis A vaccine. You may need this if you have certain conditions or if you travel or work in places where you may be exposed to hepatitis A.  Hepatitis B vaccine. You may need this if you have certain conditions or if you travel or work in places where you may be exposed to hepatitis B.  Haemophilus influenzae type b (Hib) vaccine. You may need this if you have certain risk factors. Talk to your health care provider about which screenings and vaccines you need and how often you need them. This information is not intended to replace advice given to you by your health care provider. Make sure you discuss any questions you have with your health care provider. Document Released: 02/04/2002 Document Revised: 08/28/2016 Document Reviewed:  10/10/2015 Elsevier Interactive Patient Education  2017 [employer].

## 2016-11-25 NOTE — Assessment & Plan Note (Signed)
Encouraged good sleep hygiene such as dark, quiet room. No blue/green glowing lights such as computer screens in bedroom. No alcohol or stimulants in evening. Cut down on caffeine as able. Regular exercise is helpful but not just prior to bed time. Can consider OTC meds as needed.

## 2016-11-25 NOTE — Assessment & Plan Note (Signed)
Responds to Metrogel no trouble at present

## 2017-01-24 ENCOUNTER — Encounter: Payer: Self-pay | Admitting: Family Medicine

## 2017-03-27 ENCOUNTER — Other Ambulatory Visit: Payer: Self-pay | Admitting: Family Medicine

## 2017-03-27 DIAGNOSIS — Z0000 Encounter for general adult medical examination without abnormal findings: Secondary | ICD-10-CM

## 2017-03-27 DIAGNOSIS — F4323 Adjustment disorder with mixed anxiety and depressed mood: Secondary | ICD-10-CM

## 2017-03-27 DIAGNOSIS — R945 Abnormal results of liver function studies: Secondary | ICD-10-CM

## 2017-03-27 DIAGNOSIS — R509 Fever, unspecified: Secondary | ICD-10-CM

## 2017-03-27 DIAGNOSIS — G4700 Insomnia, unspecified: Secondary | ICD-10-CM

## 2017-03-27 DIAGNOSIS — R05 Cough: Secondary | ICD-10-CM

## 2017-03-27 DIAGNOSIS — R059 Cough, unspecified: Secondary | ICD-10-CM

## 2017-03-27 NOTE — Telephone Encounter (Signed)
Faxed hardcopy for alprazolam to [facility] [place]

## 2017-03-27 NOTE — Telephone Encounter (Signed)
Requesting:   Alprazolam Contract   07/18/2015 UDS    Low risk on 11/25/2016 Last OV   11/25/2016----future appt is on 05/26/17 Last Refill   #30 with 1 refill on 11/25/2016  Please Advise

## 2017-05-26 ENCOUNTER — Ambulatory Visit: Payer: Managed Care, Other (non HMO) | Admitting: Family Medicine

## 2017-05-27 IMAGING — CT CT ABD-PELV W/ CM
2 of 4 series · 16 of 46 positions shown, 18 images · IV contrast (APPLIED)
Comparison: None.

CLINICAL DATA: Periumbilical abdominal pain and palpable mass for 1
month. Elevated liver function tests.

EXAM:
CT ABDOMEN AND PELVIS WITH CONTRAST
TECHNIQUE: Multidetector CT imaging of the abdomen and pelvis was performed
using the standard protocol following bolus administration of
intravenous contrast.
CONTRAST:  100mL ZFWT9W-6FF IOPAMIDOL (ZFWT9W-6FF) INJECTION 61%

[Series 2: axial st · axial · 0.85mm/px · z∈[-646,-212]mm · 13 of 95 slices shown, 15 images]
[im 4/95  soft-tissue]
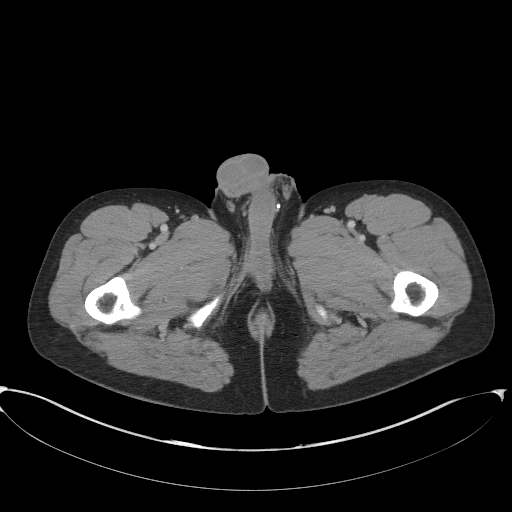
[im 4/95  bone]
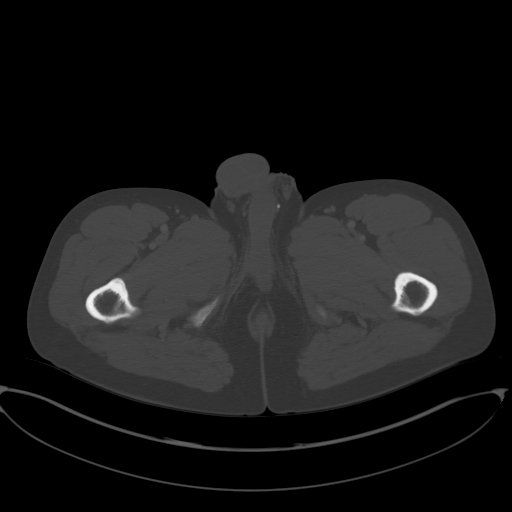
[im 12/95  soft-tissue]
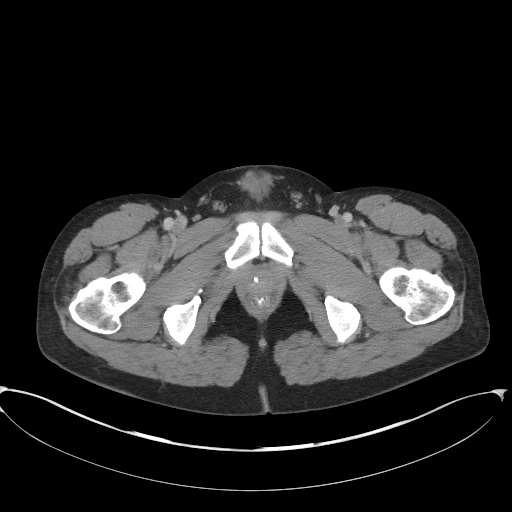
[im 20/95  soft-tissue]
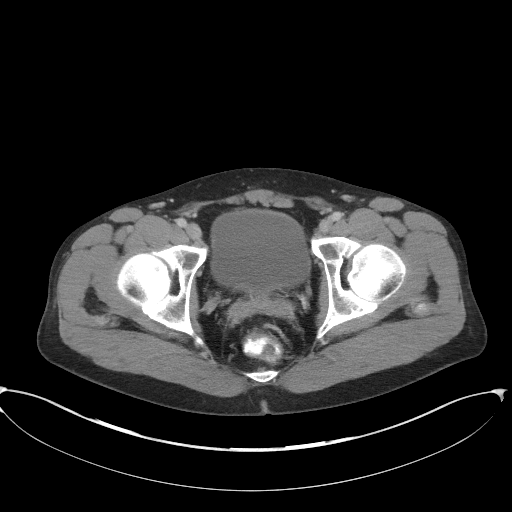
[im 28/95  soft-tissue]
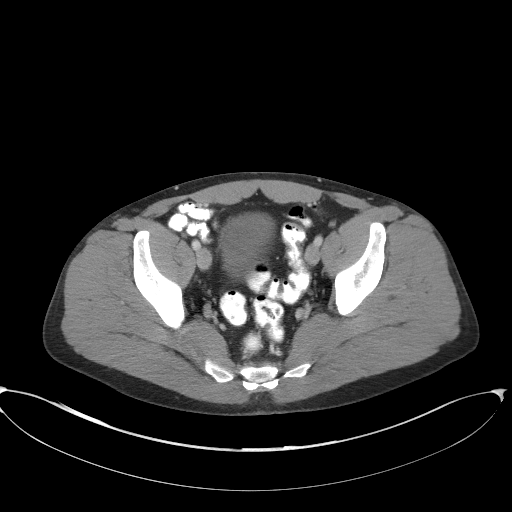
[im 32/95  soft-tissue]
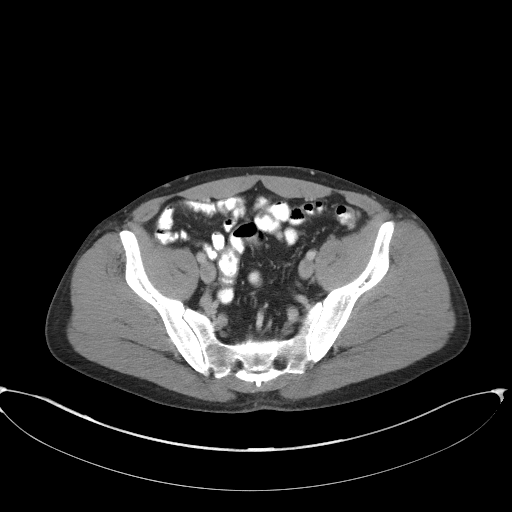
[im 40/95  soft-tissue]
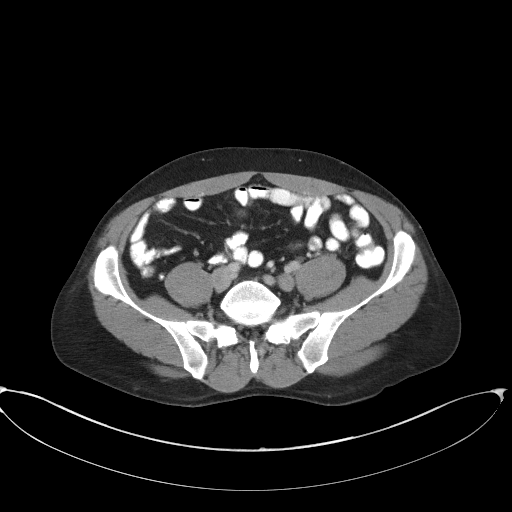
[im 48/95  soft-tissue]
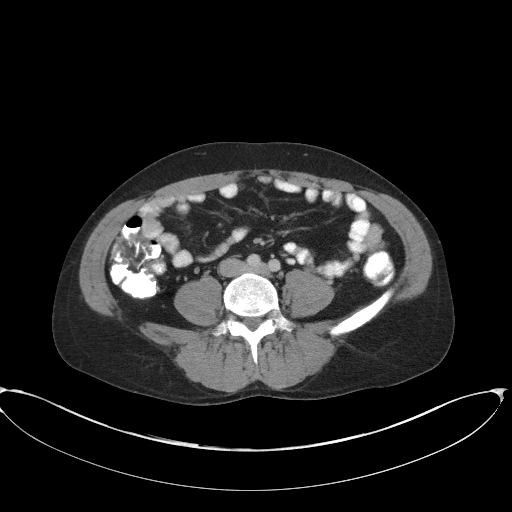
[im 55/95  soft-tissue]
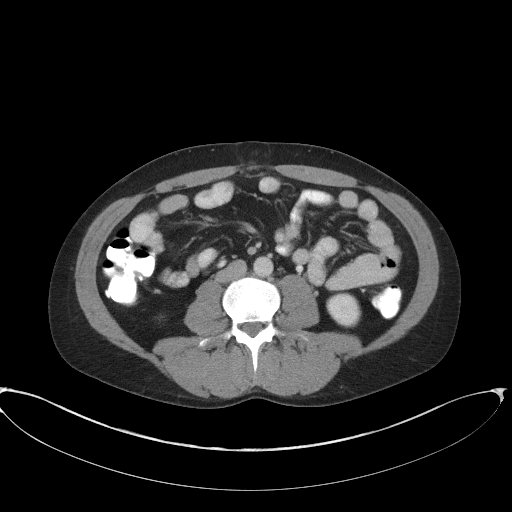
[im 63/95  soft-tissue]
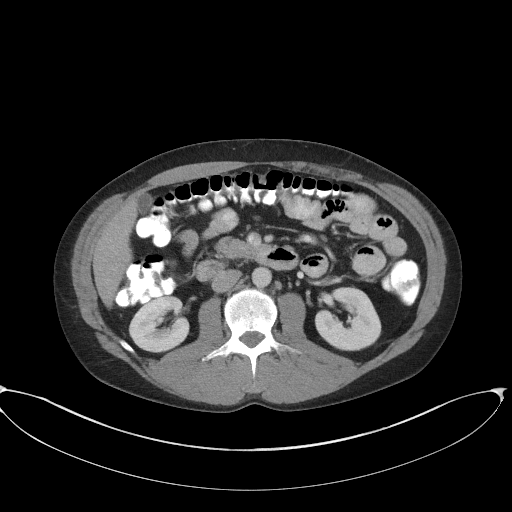
[im 63/95  bone]
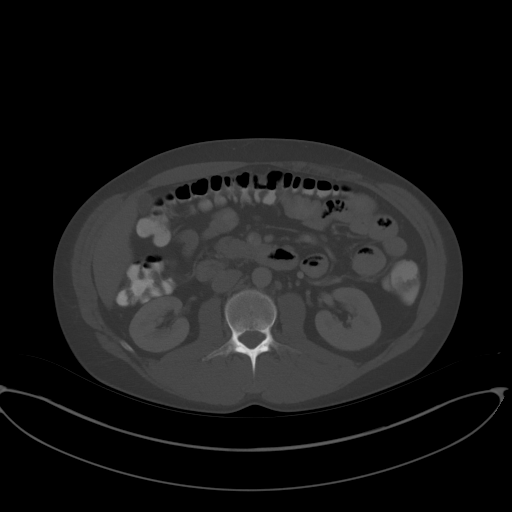
[im 67/95  soft-tissue]
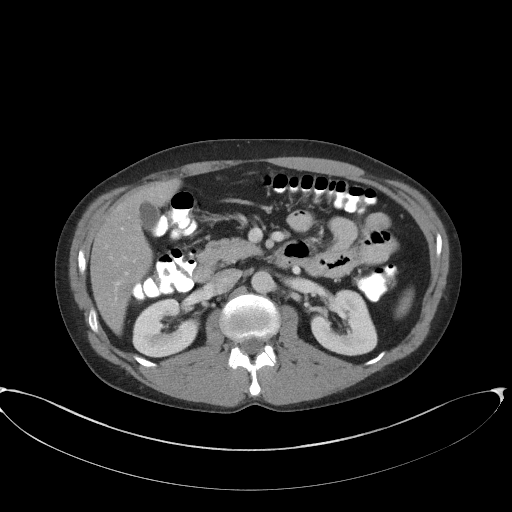
[im 75/95  soft-tissue]
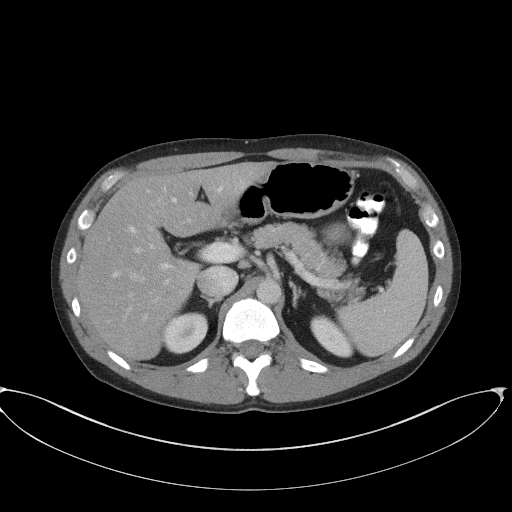
[im 83/95  soft-tissue]
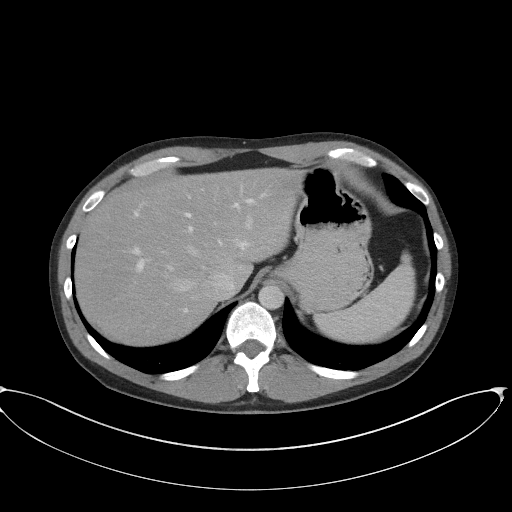
[im 91/95  soft-tissue]
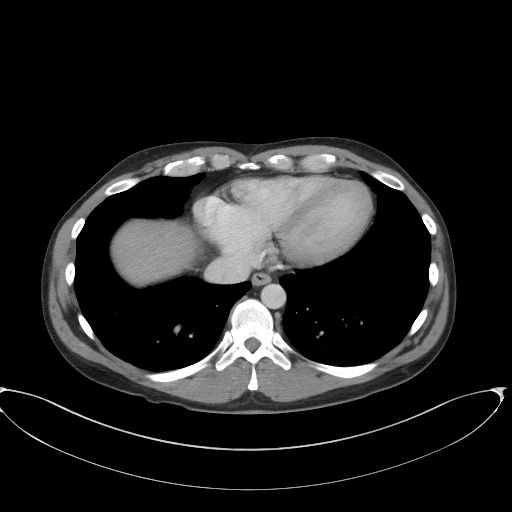

[Series 5: coronal st · coronal · 0.91mm/px · 3 of 80 slices shown]
[im 27/80  soft-tissue]
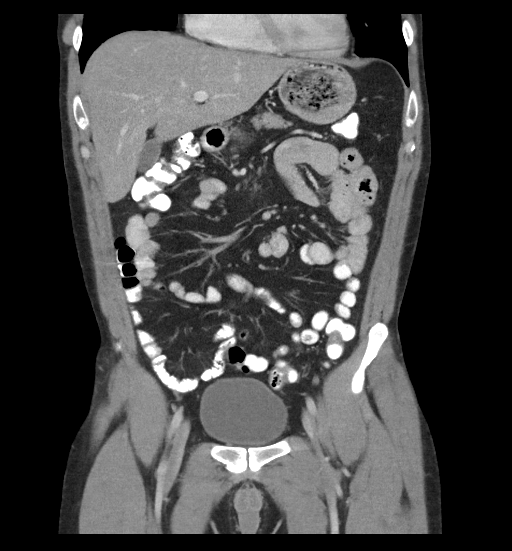
[im 36/80  soft-tissue]
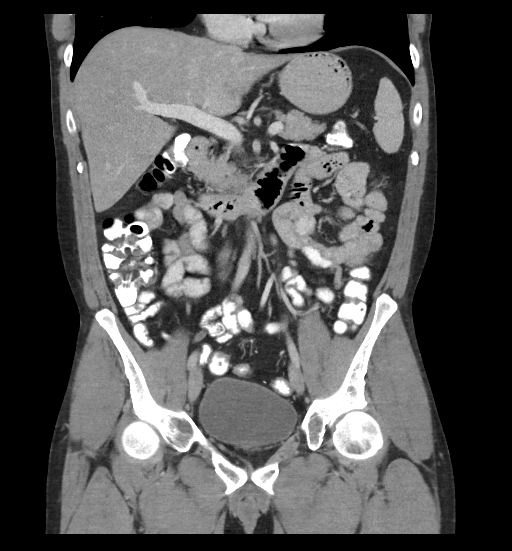
[im 44/80  soft-tissue]
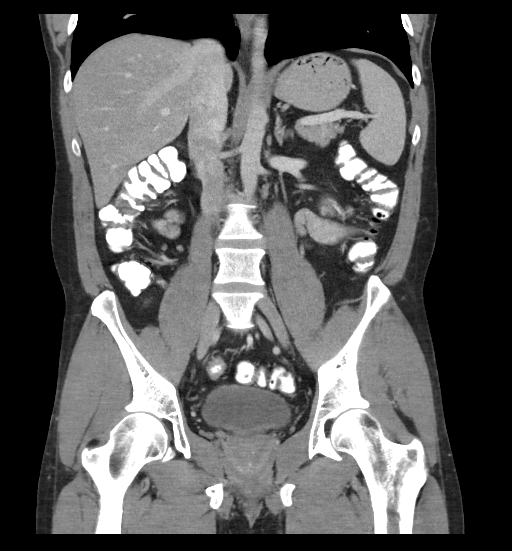

[16 of 46 positions shown; findings below may reference images not displayed]

FINDINGS: Lower chest:  No acute findings.

Hepatobiliary: Mild hepatic steatosis noted. A 14 mm flash filling
hypervascular lesion is seen in the anterior liver dome on image
10/series 2, and a similar 8 mm flash filling subcapsular
hypervascular lesion is seen in segment 2 of the left lobe also on
image 10. These are likely benign in etiology such as flash filling
hemangiomas or focal nodular hyperplasia. Gallbladder is
unremarkable.

Pancreas: No mass, inflammatory changes, or other significant
abnormality.

Spleen: Within normal limits in size and appearance.

Adrenals/Urinary Tract: No masses identified. No evidence of
hydronephrosis. Unopacified urinary bladder is unremarkable in
appearance.

Stomach/Bowel: No evidence of obstruction, inflammatory process, or
abnormal fluid collections. Normal appendix visualized.

Vascular/Lymphatic: No pathologically enlarged lymph nodes. No
evidence of abdominal aortic aneurysm.

Reproductive: No mass or other significant abnormality.

Other: A small periumbilical anterior abdominal wall hernia is seen
containing only fat. No evidence herniated bowel loops.

Musculoskeletal:  No suspicious bone lesions identified.
IMPRESSION: Small periumbilical ventral hernia containing only fat.

Mild hepatic steatosis. Two small hypervascular liver lesions are
most likely benign in etiology, with most likely considerations
including flash filling hemangiomas or focal nodular hyperplasia.
Consider follow-up imaging with abdomen MRI without and with
contrast in 6 months.

## 2017-07-21 ENCOUNTER — Ambulatory Visit: Payer: Self-pay | Admitting: Family Medicine

## 2017-07-29 ENCOUNTER — Ambulatory Visit: Payer: Managed Care, Other (non HMO) | Admitting: Family Medicine

## 2017-08-20 ENCOUNTER — Telehealth: Payer: Self-pay | Admitting: Medical

## 2017-08-20 ENCOUNTER — Encounter: Payer: Self-pay | Admitting: Medical

## 2017-08-20 ENCOUNTER — Ambulatory Visit (HOSPITAL_BASED_OUTPATIENT_CLINIC_OR_DEPARTMENT_OTHER)
Admission: RE | Admit: 2017-08-20 | Discharge: 2017-08-20 | Disposition: A | Discharge status: Home / Self Care | Payer: 59 | Source: Ambulatory Visit | Attending: Medical | Admitting: Medical

## 2017-08-20 ENCOUNTER — Ambulatory Visit (INDEPENDENT_AMBULATORY_CARE_PROVIDER_SITE_OTHER): Payer: 59 | Admitting: Medical

## 2017-08-20 VITALS — BP 132/74 | HR 67 | Temp 98.5°F | Resp 16 | Ht 70.0 in | Wt 172.0 lb

## 2017-08-20 DIAGNOSIS — M79641 Pain in right hand: Secondary | ICD-10-CM

## 2017-08-20 DIAGNOSIS — Q7021 Fused toes, right foot: Secondary | ICD-10-CM | POA: Diagnosis not present

## 2017-08-20 DIAGNOSIS — M25561 Pain in right knee: Secondary | ICD-10-CM | POA: Insufficient documentation

## 2017-08-20 DIAGNOSIS — M25461 Effusion, right knee: Secondary | ICD-10-CM | POA: Insufficient documentation

## 2017-08-20 MED ORDER — TRAMADOL HCL 50 MG PO TABS: 50.0000 mg | ORAL_TABLET | Freq: Four times a day (QID) | ORAL | 0 refills | Status: DC | PRN

## 2017-08-20 MED ORDER — DICLOFENAC SODIUM 75 MG PO TBEC: 75.0000 mg | DELAYED_RELEASE_TABLET | Freq: Two times a day (BID) | ORAL | 0 refills | Status: DC
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
## 2017-08-20 NOTE — Telephone Encounter (Signed)
Referral to ortho placed.

## 2017-08-20 NOTE — Patient Instructions (Addendum)
For your right knee pain and hand pain weill get x-rays of both.  Regarding the right knee you have some pain in medial ligament region and some in region of meniscus. Will follow the x-ray results but will plan to refer you to orthopedist as pain present and persisting for 2 weeks.   We'll follow x-ray of the right hand as well and see if we need to get opinion from your former hand surgeon at [facility] orthopedist.  For pain wrote prescription of diclofenac. For pain that persists despite diclofenac, I am writing prescription of tramadol. Try to use this as sparingly as possible. I think it would be adequate enough to help you sleep. The prescription is written for 1 tablet every 6 hours as needed. Max dose could be 2 tablets.  Follow-up in 2 weeks with [place] or as needed.

## 2017-08-26 ENCOUNTER — Encounter: Payer: Self-pay | Admitting: Family Medicine

## 2017-08-26 ENCOUNTER — Other Ambulatory Visit: Payer: Self-pay | Admitting: Family Medicine

## 2017-08-26 DIAGNOSIS — L309 Dermatitis, unspecified: Secondary | ICD-10-CM

## 2017-09-07 ENCOUNTER — Other Ambulatory Visit: Payer: Self-pay | Admitting: Family Medicine

## 2017-09-07 DIAGNOSIS — F4323 Adjustment disorder with mixed anxiety and depressed mood: Secondary | ICD-10-CM

## 2017-09-07 DIAGNOSIS — R05 Cough: Secondary | ICD-10-CM

## 2017-09-07 DIAGNOSIS — R945 Abnormal results of liver function studies: Secondary | ICD-10-CM

## 2017-09-07 DIAGNOSIS — R059 Cough, unspecified: Secondary | ICD-10-CM

## 2017-09-07 DIAGNOSIS — Z0000 Encounter for general adult medical examination without abnormal findings: Secondary | ICD-10-CM

## 2017-09-07 DIAGNOSIS — R509 Fever, unspecified: Secondary | ICD-10-CM

## 2017-09-07 DIAGNOSIS — G4700 Insomnia, unspecified: Secondary | ICD-10-CM

## 2017-09-08 ENCOUNTER — Telehealth: Payer: Self-pay

## 2017-09-08 DIAGNOSIS — F418 Other specified anxiety disorders: Secondary | ICD-10-CM

## 2017-09-08 DIAGNOSIS — R059 Cough, unspecified: Secondary | ICD-10-CM

## 2017-09-08 DIAGNOSIS — R945 Abnormal results of liver function studies: Secondary | ICD-10-CM

## 2017-09-08 DIAGNOSIS — F4323 Adjustment disorder with mixed anxiety and depressed mood: Secondary | ICD-10-CM

## 2017-09-08 DIAGNOSIS — R509 Fever, unspecified: Secondary | ICD-10-CM

## 2017-09-08 DIAGNOSIS — Z0000 Encounter for general adult medical examination without abnormal findings: Secondary | ICD-10-CM

## 2017-09-08 DIAGNOSIS — Z79899 Other long term (current) drug therapy: Secondary | ICD-10-CM

## 2017-09-08 DIAGNOSIS — R05 Cough: Secondary | ICD-10-CM

## 2017-09-08 DIAGNOSIS — G4700 Insomnia, unspecified: Secondary | ICD-10-CM

## 2017-09-08 NOTE — Telephone Encounter (Signed)
He can have a 30 day supply but he needs a uds, contract and an appt for follow up with PMD before next refill can be given, please schedule

## 2017-09-08 NOTE — Telephone Encounter (Signed)
Sent req to PCP/thx dmf 

## 2017-09-08 NOTE — Telephone Encounter (Signed)
Requesting: Alprazolam Contract: Yes UDS: 7.26.16 low-risk Last OV: 8.29.18 with ES and 12.04.17 with SB Next OV: Not scheduled/was advised to F/U 6-mo at Dec OV Last Refill: 5.29.18   Please advise

## 2017-09-09 ENCOUNTER — Other Ambulatory Visit: Payer: Self-pay | Admitting: Family Medicine

## 2017-09-09 DIAGNOSIS — R945 Abnormal results of liver function studies: Secondary | ICD-10-CM

## 2017-09-09 DIAGNOSIS — Z0000 Encounter for general adult medical examination without abnormal findings: Secondary | ICD-10-CM

## 2017-09-09 DIAGNOSIS — R509 Fever, unspecified: Secondary | ICD-10-CM

## 2017-09-09 DIAGNOSIS — G4700 Insomnia, unspecified: Secondary | ICD-10-CM

## 2017-09-09 DIAGNOSIS — F4323 Adjustment disorder with mixed anxiety and depressed mood: Secondary | ICD-10-CM

## 2017-09-09 DIAGNOSIS — R059 Cough, unspecified: Secondary | ICD-10-CM

## 2017-09-09 DIAGNOSIS — R05 Cough: Secondary | ICD-10-CM

## 2017-09-09 MED ORDER — ALPRAZOLAM 0.25 MG PO TABS: ORAL_TABLET | ORAL | 0 refills | Status: DC

## 2017-09-09 NOTE — Telephone Encounter (Signed)
Printed 30d Alprazolam/printed new Controlled Contract/will advise when signed to come in and submit UDS as well as sign contract/created future order for UDS [id number] with Dx: F41.8 & Z79.899/thx dmf

## 2017-09-09 NOTE — Telephone Encounter (Signed)
Pt aware to come complete Controlled Contract and submit UDS in order to P/U Rx for Alprazolam/thx dmf

## 2017-09-09 NOTE — Telephone Encounter (Signed)
Patient coming to office to P/U Rx/thx dmf

## 2017-09-25 ENCOUNTER — Other Ambulatory Visit (INDEPENDENT_AMBULATORY_CARE_PROVIDER_SITE_OTHER): Payer: 59

## 2017-09-25 DIAGNOSIS — Z79899 Other long term (current) drug therapy: Secondary | ICD-10-CM

## 2017-09-25 DIAGNOSIS — F418 Other specified anxiety disorders: Secondary | ICD-10-CM | POA: Diagnosis not present

## 2017-09-28 LAB — PAIN MGMT, PROFILE 8 W/CONF, U
6 ACETYLMORPHINE: NEGATIVE ng/mL (ref <10)
ALCOHOL METABOLITES: POSITIVE ng/mL — AB (ref <500)
Amphetamines: NEGATIVE ng/mL (ref <500)
BENZODIAZEPINES: NEGATIVE ng/mL (ref <100)
Buprenorphine, Urine: NEGATIVE ng/mL (ref <5)
COCAINE METABOLITE: NEGATIVE ng/mL (ref <150)
Creatinine: 182 mg/dL
Ethyl Sulfate (ETS): >40000 ng/mL — ABNORMAL HIGH (ref <100)
MARIJUANA METABOLITE: NEGATIVE ng/mL (ref <20)
MDMA: NEGATIVE ng/mL (ref <500)
Opiates: NEGATIVE ng/mL (ref <100)
Oxidant: NEGATIVE ug/mL (ref <200)
Oxycodone: NEGATIVE ng/mL (ref <100)
pH: 6.42 (ref 4.5–9.0)

## 2017-10-08 ENCOUNTER — Encounter: Payer: Self-pay | Admitting: Family Medicine

## 2017-10-08 ENCOUNTER — Other Ambulatory Visit: Payer: Self-pay | Admitting: Family Medicine

## 2017-10-08 MED ORDER — METRONIDAZOLE 1 % EX GEL
Freq: Every day | CUTANEOUS | 0 refills | Status: DC
Start: 2017-10-08 — End: 2018-10-27

## 2017-10-09 ENCOUNTER — Other Ambulatory Visit: Payer: Self-pay | Admitting: Family Medicine

## 2017-10-09 DIAGNOSIS — L719 Rosacea, unspecified: Secondary | ICD-10-CM

## 2017-10-14 ENCOUNTER — Encounter: Payer: Self-pay | Admitting: Family Medicine

## 2018-08-26 ENCOUNTER — Other Ambulatory Visit: Payer: Self-pay | Admitting: Family Medicine

## 2018-08-26 DIAGNOSIS — F4323 Adjustment disorder with mixed anxiety and depressed mood: Secondary | ICD-10-CM

## 2018-08-26 DIAGNOSIS — R945 Abnormal results of liver function studies: Secondary | ICD-10-CM

## 2018-08-26 DIAGNOSIS — R509 Fever, unspecified: Secondary | ICD-10-CM

## 2018-08-26 DIAGNOSIS — Z0000 Encounter for general adult medical examination without abnormal findings: Secondary | ICD-10-CM

## 2018-08-26 DIAGNOSIS — R05 Cough: Secondary | ICD-10-CM

## 2018-08-26 DIAGNOSIS — G4700 Insomnia, unspecified: Secondary | ICD-10-CM

## 2018-08-26 DIAGNOSIS — R059 Cough, unspecified: Secondary | ICD-10-CM

## 2018-08-26 NOTE — Telephone Encounter (Signed)
Requesting:xanax Contract:yes will need one 09/25/18 KOE:[id number] risk next screen 03/30/18 Last OV:08/20/17 Next OV:not scheduled  Last Refill:09/09/17  #30-0rf Database:   Please advise

## 2018-09-11 DIAGNOSIS — L700 Acne vulgaris: Secondary | ICD-10-CM | POA: Diagnosis not present

## 2018-10-04 DIAGNOSIS — S0091XA Abrasion of unspecified part of head, initial encounter: Secondary | ICD-10-CM | POA: Diagnosis not present

## 2018-10-04 DIAGNOSIS — S060X1A Concussion with loss of consciousness of 30 minutes or less, initial encounter: Secondary | ICD-10-CM | POA: Diagnosis not present

## 2018-10-04 DIAGNOSIS — Z79899 Other long term (current) drug therapy: Secondary | ICD-10-CM | POA: Diagnosis not present

## 2018-10-04 DIAGNOSIS — R55 Syncope and collapse: Secondary | ICD-10-CM | POA: Diagnosis not present

## 2018-10-04 DIAGNOSIS — W1789XA Other fall from one level to another, initial encounter: Secondary | ICD-10-CM | POA: Diagnosis not present

## 2018-10-04 DIAGNOSIS — F17290 Nicotine dependence, other tobacco product, uncomplicated: Secondary | ICD-10-CM | POA: Diagnosis not present

## 2018-10-04 DIAGNOSIS — S0990XA Unspecified injury of head, initial encounter: Secondary | ICD-10-CM | POA: Diagnosis not present

## 2018-10-20 ENCOUNTER — Telehealth: Payer: Self-pay

## 2018-10-20 ENCOUNTER — Ambulatory Visit: Payer: Self-pay

## 2018-10-20 ENCOUNTER — Encounter: Payer: Self-pay | Admitting: Family Medicine

## 2018-10-20 NOTE — Telephone Encounter (Signed)
I see he has an appt with concussion clinic tomorrow but I have not seen him in nearly 2 years. Please call and have him schedule a follow up visit here also
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
## 2018-10-20 NOTE — Telephone Encounter (Signed)
Notified pt of below and scheduled appt with Dr Charlett Blake for 10/27/18 at 4:15pm.
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
## 2018-10-21 ENCOUNTER — Ambulatory Visit: Payer: BLUE CROSS/BLUE SHIELD | Admitting: Family Medicine

## 2018-10-21 ENCOUNTER — Encounter: Payer: Self-pay | Admitting: Family Medicine

## 2018-10-21 DIAGNOSIS — F329 Major depressive disorder, single episode, unspecified: Secondary | ICD-10-CM | POA: Diagnosis not present

## 2018-10-21 DIAGNOSIS — F419 Anxiety disorder, unspecified: Secondary | ICD-10-CM

## 2018-10-21 DIAGNOSIS — F32A Depression, unspecified: Secondary | ICD-10-CM

## 2018-10-21 MED ORDER — HYDROXYZINE HCL 10 MG PO TABS: 10.0000 mg | ORAL_TABLET | Freq: Three times a day (TID) | ORAL | 0 refills | Status: DC | PRN

## 2018-10-21 MED ORDER — TRAZODONE HCL 50 MG PO TABS: 50.0000 mg | ORAL_TABLET | Freq: Every evening | ORAL | 3 refills | Status: DC | PRN

## 2018-10-21 NOTE — Patient Instructions (Addendum)
Good to see you  Hydroxyzine up to 3 times a day as needed for anxiety or palpatatioins.  Trazadone 50 mg at night should help with sleep  Fish oil 2 grams daily  Vitamin D 2000 IU daily  Tart cherry extract any dose at night

## 2018-10-21 NOTE — Assessment & Plan Note (Signed)
Likely contributing.  Head injury exacerbated.  Multiple other stressors in life.  Trazodone and hydroxyzine given.  Encourage patient to follow-up with primary care.  Follow-up again in 2 weeks

## 2018-10-27 ENCOUNTER — Encounter: Payer: Self-pay | Admitting: Family Medicine

## 2018-10-27 ENCOUNTER — Ambulatory Visit: Payer: BLUE CROSS/BLUE SHIELD | Admitting: Family Medicine

## 2018-10-27 DIAGNOSIS — F419 Anxiety disorder, unspecified: Secondary | ICD-10-CM | POA: Diagnosis not present

## 2018-10-27 DIAGNOSIS — S060X9S Concussion with loss of consciousness of unspecified duration, sequela: Secondary | ICD-10-CM

## 2018-10-27 DIAGNOSIS — F329 Major depressive disorder, single episode, unspecified: Secondary | ICD-10-CM | POA: Diagnosis not present

## 2018-10-27 DIAGNOSIS — F32A Depression, unspecified: Secondary | ICD-10-CM

## 2018-10-27 NOTE — Patient Instructions (Signed)
Concussion, Adult  A concussion is a brain injury from a direct hit (blow) to the head or body. This blow causes the brain to shake quickly back and forth inside the skull. This can damage brain cells and cause chemical changes in the brain. A concussion may also be known as a mild traumatic brain injury (TBI).  Concussions are usually not life-threatening, but the effects of a concussion can be serious. If you have a concussion, you are more likely to experience concussion-like symptoms after a direct blow to the head in the future.  What are the causes?  This condition is caused by:  · A direct blow to the head, such as from running into another player during a game, being hit in a fight, or hitting your head on a hard surface.  · A jolt of the head or neck that causes the brain to move back and forth inside the skull, such as in a car crash.    What are the signs or symptoms?  The signs of a concussion can be hard to notice. Early on, they may be missed by you, family members, and health care providers. You may look fine but act or feel differently.  Symptoms are usually temporary, but they may last for days, weeks, or even longer. Some symptoms may appear right away but other symptoms may not show up for hours or days. Every head injury is different. Symptoms may include:  · Headaches. This can include a feeling of pressure in the head.  · Memory problems.  · Trouble concentrating, organizing, or making decisions.  · Slowness in thinking, acting or reacting, speaking, or reading.  · Confusion.  · Fatigue.  · Changes in eating or sleeping patterns.  · Problems with coordination or balance.  · Nausea or vomiting.  · Numbness or tingling.  · Sensitivity to light or noise.  · Vision or hearing problems.  · Reduced sense of smell.  · Irritability or mood changes.  · Dizziness.  · Lack of motivation.  · Seeing or hearing things that other people do not see or hear (hallucinations).    How is this diagnosed?  This  condition is diagnosed based on:  · Your symptoms.  · A description of your injury.    You may also have tests, including:  · Imaging tests, such as a CT scan or MRI. These are done to look for signs of brain injury.  · Neuropsychological tests. These measure your thinking, understanding, learning, and remembering abilities.    How is this treated?  This condition is treated with physical and mental rest and careful observation, usually at home. If the concussion is severe, you may need to stay home from work for a while. You may be referred to a concussion clinic or to other health care providers for management. It is important that you tell your health care provider if:  · You are taking any medicines, including prescription medicines, over-the-counter medicines, and natural remedies. Some medicines, such as blood thinners (anticoagulants) and aspirin, may increase the chance of complications, such as bleeding.  · You are taking or have taken alcohol or illegal drugs. Alcohol and certain other drugs may slow your recovery and can put you at risk of further injury.    How fast you will recover from a concussion depends on many factors, such as how severe your concussion is, what part of your brain was injured, how old you are, and how healthy you were before   the concussion. Recovery can take time. It is important to wait to return to activity until a health care provider says it is safe to do that and your symptoms are completely gone.  Follow these instructions at home:  Activity  · Limit activities that require a lot of thought or concentration. These may include:  ? Doing homework or job-related work.  ? Watching TV.  ? Working on the computer.  ? Playing memory games and puzzles.  · Rest. Rest helps the brain to heal. Make sure you:  ? Get plenty of sleep at night. Avoid staying up late at night.  ? Keep the same bedtime hours on weekends and weekdays.  ? Rest during the day. Take naps or rest breaks when you  feel tired.  · Having another concussion before the first one has healed can be dangerous. Do not do high-risk activities that could cause a second concussion, such as riding a bicycle or playing sports.  · Ask your health care provider when you can return to your normal activities, such as school, work, athletics, driving, riding a bicycle, or using heavy machinery. Your ability to react may be slower after a brain injury. Never do these activities if you are dizzy. Your health care provider will likely give you a plan for gradually returning to activities.  General instructions  · Take over-the-counter and prescription medicines only as told by your health care provider.  · Do not drink alcohol until your health care provider says you can.  · If it is harder than usual to remember things, write them down.  · If you are easily distracted, try to do one thing at a time. For example, do not try to watch TV while fixing dinner.  · Talk with family members or close friends when making important decisions.  · Watch your symptoms and tell others to do the same. Complications sometimes occur after a concussion. Older adults with a brain injury may have a higher risk of serious complications, such as a blood clot in the brain.  · Tell your teachers, school nurse, school counselor, coach, athletic trainer, or work manager about your injury, symptoms, and restrictions. Tell them about what you can or cannot do. They should watch for:  ? Increased problems with attention or concentration.  ? Increased difficulty remembering or learning new information.  ? Increased time needed to complete tasks or assignments.  ? Increased irritability or decreased ability to cope with stress.  ? Increased symptoms.  · Keep all follow-up visits as told by your health care provider. This is important.  How is this prevented?  It is very important to avoid another brain injury, especially as you recover. In rare cases, another injury can lead  to permanent brain damage, brain swelling, or death. The risk of this is greatest during the first 7-10 days after a head injury. Avoid injuries by:  · Wearing a seat belt when riding in a car.  · Wearing a helmet when biking, skiing, skateboarding, skating, or doing similar activities.  · Avoiding activities that could lead to a second concussion, such as contact or recreational sports, until your health care provider says it is okay.  · Taking safety measures in your home, such as:  ? Removing clutter and tripping hazards from floors and stairways.  ? Using grab bars in bathrooms and handrails by stairs.  ? Placing non-slip mats on floors and in bathtubs.  ? Improving lighting in dim areas.      Contact a health care provider if:  · Your symptoms get worse.  · You have new symptoms.  · You continue to have symptoms for more than 2 weeks.  Get help right away if:  · You have severe or worsening headaches.  · You have weakness or numbness in any part of your body.  · Your coordination gets worse.  · You vomit repeatedly.  · You are sleepier.  · The pupil of one eye is larger than the other.  · You have convulsions or a seizure.  · Your speech is slurred.  · Your fatigue, confusion, or irritability gets worse.  · You cannot recognize people or places.  · You have neck pain.  · It is difficult to wake you up.  · You have unusual behavior changes.  · You lose consciousness.  Summary  · A concussion is a brain injury from a direct hit (blow) to the head or body.  · A concussion may also be called a mild traumatic brain injury (TBI).  · You may have imaging tests and neuropsychological tests to diagnose a concussion.  · This condition is treated with physical and mental rest and careful observation.  · Ask your health care provider when you can return to your normal activities, such as school, work, athletics, driving, riding a bicycle, or using heavy machinery. Follow safety instructions as told by your health care  provider.  This information is not intended to replace advice given to you by your health care provider. Make sure you discuss any questions you have with your health care provider.  Document Released: 02/29/2004 Document Revised: 11/19/2016 Document Reviewed: 11/19/2016  [employer] Interactive Patient Education © 2018 [employer]

## 2018-10-28 DIAGNOSIS — S060XAA Concussion with loss of consciousness status unknown, initial encounter: Secondary | ICD-10-CM | POA: Insufficient documentation

## 2018-10-28 DIAGNOSIS — S060X9A Concussion with loss of consciousness of unspecified duration, initial encounter: Secondary | ICD-10-CM | POA: Insufficient documentation
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
## 2018-10-28 NOTE — Assessment & Plan Note (Addendum)
Has been following with Sports medicine and he is improving some. No other recent fall or head injury. He reports he feels much better and his trouble sleeping, nausea and vomitting are improving. He will follow up with them but he is doing much better.

## 2018-10-29 NOTE — Assessment & Plan Note (Signed)
His anxiety has been activated since his concussion, the Trazodone has helped his sleep and the Hydroxyzine helps his anxiety. He discussed SSRIs at length but he has suffered from side effects in the past so will hold off for now and reevaluate at next visit or as needed.
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
## 2018-11-04 ENCOUNTER — Encounter: Payer: Self-pay | Admitting: Family Medicine

## 2018-11-04 ENCOUNTER — Ambulatory Visit: Payer: BLUE CROSS/BLUE SHIELD | Admitting: Family Medicine

## 2018-11-04 DIAGNOSIS — S060X9S Concussion with loss of consciousness of unspecified duration, sequela: Secondary | ICD-10-CM | POA: Diagnosis not present

## 2018-11-04 NOTE — Patient Instructions (Addendum)
Good to see you  Patient is doing significant better

## 2018-11-04 NOTE — Assessment & Plan Note (Signed)
Completely resolved at this time.  No further testing necessary.  Underlying anxiety and depression is likely contributing more than anything else still but is doing well with hydroxyzine and trazodone.  Patient has seen primary care provider and have talked to more long-term medications if necessary.  Patient will follow-up with me again as needed

## 2018-11-13 ENCOUNTER — Other Ambulatory Visit: Payer: Self-pay | Admitting: Family Medicine

## 2018-11-13 NOTE — Telephone Encounter (Signed)
Refill done.  

## 2018-12-02 DIAGNOSIS — L700 Acne vulgaris: Secondary | ICD-10-CM | POA: Diagnosis not present

## 2018-12-14 ENCOUNTER — Other Ambulatory Visit: Payer: Self-pay | Admitting: Family Medicine

## 2019-02-11 ENCOUNTER — Encounter: Payer: Self-pay | Admitting: Family Medicine

## 2019-04-30 ENCOUNTER — Other Ambulatory Visit: Payer: Self-pay

## 2019-09-18 ENCOUNTER — Encounter: Payer: Self-pay | Admitting: Family Medicine

## 2020-01-25 ENCOUNTER — Encounter: Payer: Self-pay | Admitting: Family Medicine

## 2020-03-08 ENCOUNTER — Encounter: Payer: Self-pay | Admitting: Family Medicine

## 2020-03-10 ENCOUNTER — Ambulatory Visit: Payer: BLUE CROSS/BLUE SHIELD

## 2020-03-10 ENCOUNTER — Ambulatory Visit: Payer: BC Managed Care – PPO | Attending: Internal Medicine

## 2020-03-10 DIAGNOSIS — Z23 Encounter for immunization: Secondary | ICD-10-CM

## 2020-03-10 NOTE — Telephone Encounter (Signed)
Doxy, or In person.. when does he need an appt and what is he bing see for?

## 2020-03-10 NOTE — Progress Notes (Signed)
   Covid-19 Vaccination Clinic  Name:  Naason Cuffee    MRN: [id number] DOB: Jan 19, 1981  03/10/2020  Mr. Southards was observed post Covid-19 immunization for 15 minutes without incident. He was provided with Vaccine Information Sheet and instruction to access the V-Safe system.   Mr. Rivett was instructed to call 911 with any severe reactions post vaccine: Marland Kitchen Difficulty breathing  . Swelling of face and throat  . A fast heartbeat  . A bad rash all over body  . Dizziness and weakness   Immunizations Administered    Name Date Dose VIS Date Route   Moderna COVID-19 Vaccine 03/10/2020 10:05 AM 0.5 mL 11/23/2019 Intramuscular   Manufacturer: Moderna   Lot: [id number]   [id number]

## 2020-03-10 NOTE — Telephone Encounter (Signed)
Please schedule patient an appointment.  

## 2020-03-13 NOTE — Telephone Encounter (Signed)
Either or he will need a CPE thanks Please advise

## 2020-03-20 ENCOUNTER — Encounter: Payer: BC Managed Care – PPO | Admitting: Internal Medicine

## 2020-04-07 ENCOUNTER — Encounter: Payer: BC Managed Care – PPO | Admitting: Internal Medicine

## 2020-04-11 ENCOUNTER — Ambulatory Visit: Payer: BC Managed Care – PPO | Attending: Internal Medicine

## 2020-04-11 DIAGNOSIS — Z23 Encounter for immunization: Secondary | ICD-10-CM

## 2020-04-11 NOTE — Progress Notes (Signed)
   Covid-19 Vaccination Clinic  Name:  Brandon [name]    MRN: [id number] DOB: 1981/12/09  04/11/2020  [name] was observed post Covid-19 immunization for 15 minutes without incident. He was provided with Vaccine Information Sheet and instruction to access the V-Safe system.   [name] was instructed to call 911 with any severe reactions post vaccine: Marland Kitchen Difficulty breathing  . Swelling of face and throat  . A fast heartbeat  . A bad rash all over body  . Dizziness and weakness   Immunizations Administered    Name Date Dose VIS Date Route   Moderna COVID-19 Vaccine 04/11/2020  9:08 AM 0.5 mL 11/2019 Intramuscular   Manufacturer: Moderna   Lot: [id number]   [id number]

## 2020-04-18 ENCOUNTER — Encounter: Payer: BC Managed Care – PPO | Admitting: Internal Medicine

## 2020-10-25 ENCOUNTER — Telehealth: Payer: Self-pay

## 2020-10-25 ENCOUNTER — Ambulatory Visit (INDEPENDENT_AMBULATORY_CARE_PROVIDER_SITE_OTHER): Payer: BC Managed Care – PPO | Admitting: Medical

## 2020-10-25 ENCOUNTER — Encounter: Payer: Self-pay | Admitting: Medical

## 2020-10-25 ENCOUNTER — Other Ambulatory Visit: Payer: Self-pay

## 2020-10-25 VITALS — BP 139/80 | HR 65 | Resp 18 | Ht 70.0 in | Wt 171.8 lb

## 2020-10-25 DIAGNOSIS — R0789 Other chest pain: Secondary | ICD-10-CM | POA: Diagnosis not present

## 2020-10-25 DIAGNOSIS — Z0000 Encounter for general adult medical examination without abnormal findings: Secondary | ICD-10-CM

## 2020-10-25 DIAGNOSIS — R7989 Other specified abnormal findings of blood chemistry: Secondary | ICD-10-CM

## 2020-10-25 DIAGNOSIS — D179 Benign lipomatous neoplasm, unspecified: Secondary | ICD-10-CM | POA: Diagnosis not present

## 2020-10-25 DIAGNOSIS — R9431 Abnormal electrocardiogram [ECG] [EKG]: Secondary | ICD-10-CM | POA: Diagnosis not present

## 2020-10-25 LAB — CBC WITH DIFFERENTIAL/PLATELET
Absolute Monocytes: 667 cells/uL (ref 200–950)
Basophils Absolute: 29 cells/uL (ref 0–200)
Basophils Relative: 0.6 %
Eosinophils Absolute: 240 cells/uL (ref 15–500)
Eosinophils Relative: 5 %
HCT: 44.4 % (ref 38.5–50.0)
Hemoglobin: 15.9 g/dL (ref 13.2–17.1)
Lymphs Abs: 797 cells/uL — ABNORMAL LOW (ref 850–3900)
MCH: 35.5 pg — ABNORMAL HIGH (ref 27.0–33.0)
MCHC: 35.8 g/dL (ref 32.0–36.0)
MCV: 99.1 fL (ref 80.0–100.0)
MPV: 10.1 fL (ref 7.5–12.5)
Monocytes Relative: 13.9 %
Neutro Abs: 3067 cells/uL (ref 1500–7800)
Neutrophils Relative %: 63.9 %
Platelets: 204 10*3/uL (ref 140–400)
RBC: 4.48 10*6/uL (ref 4.20–5.80)
RDW: 11.3 % (ref 11.0–15.0)
Total Lymphocyte: 16.6 %
WBC: 4.8 10*3/uL (ref 3.8–10.8)

## 2020-10-25 LAB — LIPID PANEL
Cholesterol: 148 mg/dL (ref <200)
HDL: 75 mg/dL (ref >=40)
LDL Cholesterol (Calc): 57 mg/dL (calc)
Non-HDL Cholesterol (Calc): 73 mg/dL (calc) (ref <130)
Total CHOL/HDL Ratio: 2 (calc) (ref <5.0)
Triglycerides: 77 mg/dL (ref <150)

## 2020-10-25 LAB — COMPREHENSIVE METABOLIC PANEL
AG Ratio: 1.8 (calc) (ref 1.0–2.5)
ALT: 69 U/L — ABNORMAL HIGH (ref 9–46)
AST: 67 U/L — ABNORMAL HIGH (ref 10–40)
Albumin: 4.4 g/dL (ref 3.6–5.1)
Alkaline phosphatase (APISO): 74 U/L (ref 36–130)
BUN: 9 mg/dL (ref 7–25)
CO2: 33 mmol/L — ABNORMAL HIGH (ref 20–32)
Calcium: 9.6 mg/dL (ref 8.6–10.3)
Chloride: 100 mmol/L (ref 98–110)
Creat: 1.06 mg/dL (ref 0.60–1.35)
Globulin: 2.4 g/dL (calc) (ref 1.9–3.7)
Glucose, Bld: 86 mg/dL (ref 65–99)
Potassium: 5 mmol/L (ref 3.5–5.3)
Sodium: 136 mmol/L (ref 135–146)
Total Bilirubin: 1.4 mg/dL — ABNORMAL HIGH (ref 0.2–1.2)
Total Protein: 6.8 g/dL (ref 6.1–8.1)

## 2020-10-25 LAB — TROPONIN I: Troponin I: 7 ng/L (ref <=47)

## 2020-10-25 NOTE — Patient Instructions (Addendum)
For you wellness exam today I have ordered cbc, cmp and lipid panel.  Vaccine up to date.  Recommend exercise and healthy diet.  We will let you know lab results as they come in.  Follow up date appointment will be determined after lab review.   Do recommend cutting back on alcohol use.  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 No gynecomastia felt at nipple. Will refer to general surgeon and see if they think imaging indicated.   ekg done today showing normal sinus rhythm but with some t wave elevation indicateing hyperkalemia(discussed with cardiologist he agrees with plan). Getting one set troponin stat and cmp    If you get recurrent pain as before/yesterday take tyelnol and ibuprofen combination and let me know if pain resolves. If you have severe pain or other type cardiac symptoms then ED evaluation. Let me know if having recurrent pain as I may go ahead and refer you to cardiologist.    Preventive Care 47-68 Years Old, Male Preventive care refers to lifestyle choices and visits with your health care provider that can promote health and wellness. This includes:  A yearly physical exam. This is also called an annual well check.  Regular dental and eye exams.  Immunizations.  Screening for certain conditions.  Healthy lifestyle choices, such as eating a healthy diet, getting regular exercise, not using drugs or products that contain nicotine and tobacco, and limiting alcohol use. What can I expect for my preventive care visit? Physical exam Your health care provider will check:  Height and weight. These may be used to calculate body mass index (BMI), which is a measurement that tells if you are at a healthy weight.  Heart rate and blood pressure.  Your skin for abnormal spots. Counseling Your health care provider may ask you questions about:  Alcohol, tobacco, and drug use.  Emotional well-being.  Home and relationship well-being.  Sexual activity.  Eating  habits.  Work and work Statistician. What immunizations do I need?  Influenza (flu) vaccine  This is recommended every year. Tetanus, diphtheria, and pertussis (Tdap) vaccine  You may need a Td booster every 10 years. Varicella (chickenpox) vaccine  You may need this vaccine if you have not already been vaccinated. Zoster (shingles) vaccine  You may need this after age 36. Measles, mumps, and rubella (MMR) vaccine  You may need at least one dose of MMR if you were born in 1957 or later. You may also need a second dose. Pneumococcal conjugate (PCV13) vaccine  You may need this if you have certain conditions and were not previously vaccinated. Pneumococcal polysaccharide (PPSV23) vaccine  You may need one or two doses if you smoke cigarettes or if you have certain conditions. Meningococcal conjugate (MenACWY) vaccine  You may need this if you have certain conditions. Hepatitis A vaccine  You may need this if you have certain conditions or if you travel or work in places where you may be exposed to hepatitis A. Hepatitis B vaccine  You may need this if you have certain conditions or if you travel or work in places where you may be exposed to hepatitis B. Haemophilus influenzae type b (Hib) vaccine  You may need this if you have certain risk factors. Human papillomavirus (HPV) vaccine  If recommended by your health care provider, you may need three doses over 6 months. You may receive vaccines as individual doses or as more than one vaccine together in one shot (combination vaccines). Talk with your  health care provider about the risks and benefits of combination vaccines. What tests do I need? Blood tests  Lipid and cholesterol levels. These may be checked every 5 years, or more frequently if you are over 42 years old.  Hepatitis C test.  Hepatitis B test. Screening  Lung cancer screening. You may have this screening every year starting at age 23 if you have a  30-pack-year history of smoking and currently smoke or have quit within the past 15 years.  Prostate cancer screening. Recommendations will vary depending on your family history and other risks.  Colorectal cancer screening. All adults should have this screening starting at age 11 and continuing until age 69. Your health care provider may recommend screening at age 52 if you are at increased risk. You will have tests every 1-10 years, depending on your results and the type of screening test.  Diabetes screening. This is done by checking your blood sugar (glucose) after you have not eaten for a while (fasting). You may have this done every 1-3 years.  Sexually transmitted disease (STD) testing. Follow these instructions at home: Eating and drinking  Eat a diet that includes fresh fruits and vegetables, whole grains, lean protein, and low-fat dairy products.  Take vitamin and mineral supplements as recommended by your health care provider.  Do not drink alcohol if your health care provider tells you not to drink.  If you drink alcohol: ? Limit how much you have to 0-2 drinks a day. ? Be aware of how much alcohol is in your drink. In the [place] one drink equals one 12 oz bottle of beer (355 mL), one 5 oz glass of wine (148 mL), or one 1 oz glass of hard liquor (44 mL). Lifestyle  Take daily care of your teeth and gums.  Stay active. Exercise for at least 30 minutes on 5 or more days each week.  Do not use any products that contain nicotine or tobacco, such as cigarettes, e-cigarettes, and chewing tobacco. If you need help quitting, ask your health care provider.  If you are sexually active, practice safe sex. Use a condom or other form of protection to prevent STIs (sexually transmitted infections).  Talk with your health care provider about taking a low-dose aspirin every day starting at age 5. What's next?  Go to your health care provider once a year for a well check visit.  Ask  your health care provider how often you should have your eyes and teeth checked.  Stay up to date on all vaccines. This information is not intended to replace advice given to you by your health care provider. Make sure you discuss any questions you have with your health care provider. Document Revised: 12/03/2018 Document Reviewed: 12/03/2018 Elsevier Patient Education  2020 [employer].
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
## 2020-10-26 ENCOUNTER — Encounter: Payer: Self-pay | Admitting: Medical

## 2020-10-26 ENCOUNTER — Telehealth: Payer: Self-pay | Admitting: Medical

## 2020-10-26 DIAGNOSIS — R748 Abnormal levels of other serum enzymes: Secondary | ICD-10-CM

## 2020-10-26 NOTE — Telephone Encounter (Signed)
Patient had CPE with Percell Miller yesterday and upon check out, discussion ensued regarding a TOC.  Patient would like to initiate TOC from Dr. Charlett Blake to Percell Miller.  Please advise.

## 2020-10-26 NOTE — Telephone Encounter (Signed)
US liver/gb placed.

## 2020-10-26 NOTE — Telephone Encounter (Signed)
Ok to transfer. 

## 2020-10-26 NOTE — Telephone Encounter (Signed)
OK with me.

## 2020-11-03 ENCOUNTER — Ambulatory Visit (HOSPITAL_BASED_OUTPATIENT_CLINIC_OR_DEPARTMENT_OTHER): Payer: BC Managed Care – PPO

## 2020-12-05 ENCOUNTER — Ambulatory Visit: Payer: BC Managed Care – PPO | Admitting: Internal Medicine

## 2020-12-05 DIAGNOSIS — Z0289 Encounter for other administrative examinations: Secondary | ICD-10-CM
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
## 2020-12-12 DIAGNOSIS — Z20822 Contact with and (suspected) exposure to covid-19: Secondary | ICD-10-CM | POA: Diagnosis not present

## 2020-12-28 ENCOUNTER — Encounter: Payer: Self-pay | Admitting: Medical

## 2020-12-29 ENCOUNTER — Telehealth: Payer: Self-pay | Admitting: Medical

## 2020-12-29 NOTE — Telephone Encounter (Signed)
Lilia Pro From [facility] has called him left a message as well. She said appt time was looking like Feb. If we calls back.

## 2020-12-29 NOTE — Telephone Encounter (Signed)
I followed up with [facility] on referral to see if patient was ever contacted. S./w Ms. Doris and she stated that patient was never contacted.. Since patient requested to be sent to another location, I have sent referral over to [place]. I called patient and left him a message stating that I sent him to another provider and for him to call me if he needs further assistance.Brandon [name]

## 2020-12-29 NOTE — Telephone Encounter (Signed)
I put in referral for patient to see general surgeon in early November.  Pt had contacted [place] and notified Brandon [name] on delay.  She notified patient referral was still in progress.  I was not aware of this.  Patient now sends me a message stating Brandon [name] was never contacted and states Brandon [name] would get care elsewhere.  Would you call patient and apologize.  Let him know I was never informed that specialist never contacted him.  I will mention this to referral staff and make sure the referral goes through.

## 2020-12-29 NOTE — Telephone Encounter (Signed)
I sent him my chart message as well. Hopefully he will respond.  Thanks,

## 2020-12-30 NOTE — Telephone Encounter (Signed)
Unclear about if we call back? Will they see pt sooner. Since quite a delay and pt expressed frustration I do think better if he is seen sooner.

## 2021-01-01 DIAGNOSIS — Z20822 Contact with and (suspected) exposure to covid-19: Secondary | ICD-10-CM | POA: Diagnosis not present

## 2021-01-01 NOTE — Telephone Encounter (Signed)
Sorry. IF "HE" Calls back. His original referral was sent to [facility] patient stated in previous message he wanted to be seen somewhere else. I reached out to [facility] and asked about the earliest and she stated Feb. Nothing sooner. At this point [facility] is awaiting his call. Patient has been left 2 messages to call them back.

## 2021-03-20 DIAGNOSIS — D692 Other nonthrombocytopenic purpura: Secondary | ICD-10-CM | POA: Diagnosis not present

## 2021-03-20 DIAGNOSIS — I739 Peripheral vascular disease, unspecified: Secondary | ICD-10-CM | POA: Diagnosis not present

## 2021-03-22 DIAGNOSIS — I739 Peripheral vascular disease, unspecified: Secondary | ICD-10-CM | POA: Diagnosis not present

## 2021-10-26 ENCOUNTER — Encounter: Payer: BC Managed Care – PPO | Admitting: Medical
